# Patient Record
Sex: Female | Born: 1950 | Race: White | Hispanic: No | Marital: Married | Smoking: Never smoker
Health system: Southern US, Community
[De-identification: ages and names within clinical notes are randomized; demographics above are authoritative.]

## PROBLEM LIST (undated history)

## (undated) DIAGNOSIS — N189 Chronic kidney disease, unspecified: Secondary | ICD-10-CM

## (undated) DIAGNOSIS — E039 Hypothyroidism, unspecified: Secondary | ICD-10-CM

## (undated) DIAGNOSIS — M1990 Unspecified osteoarthritis, unspecified site: Secondary | ICD-10-CM

## (undated) DIAGNOSIS — R112 Nausea with vomiting, unspecified: Secondary | ICD-10-CM

## (undated) DIAGNOSIS — Z9889 Other specified postprocedural states: Secondary | ICD-10-CM

## (undated) DIAGNOSIS — K219 Gastro-esophageal reflux disease without esophagitis: Secondary | ICD-10-CM

## (undated) DIAGNOSIS — I10 Essential (primary) hypertension: Secondary | ICD-10-CM

## (undated) DIAGNOSIS — R911 Solitary pulmonary nodule: Secondary | ICD-10-CM

## (undated) DIAGNOSIS — E538 Deficiency of other specified B group vitamins: Secondary | ICD-10-CM

## (undated) DIAGNOSIS — E785 Hyperlipidemia, unspecified: Secondary | ICD-10-CM

## (undated) HISTORY — PX: JOINT REPLACEMENT: SHX530

## (undated) HISTORY — PX: BREAST CYST ASPIRATION: SHX578

## (undated) HISTORY — PX: DILATION AND CURETTAGE OF UTERUS: SHX78

## (undated) HISTORY — PX: APPENDECTOMY: SHX54

## (undated) HISTORY — PX: FRACTURE SURGERY: SHX138

## (undated) HISTORY — PX: CHOLECYSTECTOMY: SHX55

---

## 2005-06-02 ENCOUNTER — Ambulatory Visit: Payer: Self-pay | Admitting: Internal Medicine

## 2006-06-06 ENCOUNTER — Ambulatory Visit: Payer: Self-pay | Admitting: Internal Medicine

## 2007-01-22 ENCOUNTER — Ambulatory Visit: Payer: Self-pay | Admitting: Gastroenterology

## 2007-06-12 ENCOUNTER — Ambulatory Visit: Payer: Self-pay | Admitting: Internal Medicine

## 2008-06-12 ENCOUNTER — Ambulatory Visit: Payer: Self-pay | Admitting: Internal Medicine

## 2009-07-21 ENCOUNTER — Ambulatory Visit: Payer: Self-pay | Admitting: Internal Medicine

## 2010-08-10 ENCOUNTER — Ambulatory Visit: Payer: Self-pay | Admitting: Internal Medicine

## 2011-10-04 ENCOUNTER — Ambulatory Visit: Payer: Self-pay | Admitting: Internal Medicine

## 2012-10-04 ENCOUNTER — Ambulatory Visit: Payer: Self-pay | Admitting: Internal Medicine

## 2013-10-14 ENCOUNTER — Ambulatory Visit: Payer: Self-pay | Admitting: Internal Medicine

## 2014-10-15 ENCOUNTER — Ambulatory Visit: Payer: Self-pay | Admitting: Internal Medicine

## 2015-08-20 ENCOUNTER — Other Ambulatory Visit: Payer: Self-pay | Admitting: Internal Medicine

## 2015-08-20 DIAGNOSIS — Z1231 Encounter for screening mammogram for malignant neoplasm of breast: Secondary | ICD-10-CM

## 2015-10-20 ENCOUNTER — Ambulatory Visit
Admission: RE | Admit: 2015-10-20 | Discharge: 2015-10-20 | Disposition: A | Discharge status: Home / Self Care | Payer: BLUE CROSS/BLUE SHIELD | Source: Ambulatory Visit | Attending: Internal Medicine | Admitting: Internal Medicine

## 2015-10-20 DIAGNOSIS — Z1231 Encounter for screening mammogram for malignant neoplasm of breast: Secondary | ICD-10-CM | POA: Insufficient documentation

## 2016-08-23 ENCOUNTER — Other Ambulatory Visit: Payer: Self-pay | Admitting: Internal Medicine

## 2016-08-23 DIAGNOSIS — Z1231 Encounter for screening mammogram for malignant neoplasm of breast: Secondary | ICD-10-CM

## 2016-10-20 ENCOUNTER — Ambulatory Visit: Payer: BLUE CROSS/BLUE SHIELD

## 2016-10-21 ENCOUNTER — Ambulatory Visit
Admission: RE | Admit: 2016-10-21 | Discharge: 2016-10-21 | Disposition: A | Discharge status: Home / Self Care | Payer: BLUE CROSS/BLUE SHIELD | Source: Ambulatory Visit | Attending: Internal Medicine | Admitting: Internal Medicine

## 2016-10-21 DIAGNOSIS — Z1231 Encounter for screening mammogram for malignant neoplasm of breast: Secondary | ICD-10-CM | POA: Insufficient documentation

## 2017-08-11 ENCOUNTER — Encounter: Payer: Self-pay | Admitting: *Deleted

## 2017-08-14 ENCOUNTER — Encounter
Admission: RE | Disposition: A | Discharge status: Home / Self Care | Payer: Self-pay | Source: Ambulatory Visit | Attending: Gastroenterology

## 2017-08-14 ENCOUNTER — Ambulatory Visit: Payer: BLUE CROSS/BLUE SHIELD | Admitting: Anesthesiology

## 2017-08-14 ENCOUNTER — Encounter: Payer: Self-pay | Admitting: *Deleted

## 2017-08-14 ENCOUNTER — Ambulatory Visit
Admission: RE | Admit: 2017-08-14 | Discharge: 2017-08-14 | Disposition: A | Discharge status: Home / Self Care | Payer: BLUE CROSS/BLUE SHIELD | Source: Ambulatory Visit | Attending: Gastroenterology | Admitting: Gastroenterology

## 2017-08-14 DIAGNOSIS — Z8371 Family history of colonic polyps: Secondary | ICD-10-CM | POA: Diagnosis present

## 2017-08-14 DIAGNOSIS — Z79899 Other long term (current) drug therapy: Secondary | ICD-10-CM | POA: Diagnosis not present

## 2017-08-14 DIAGNOSIS — I10 Essential (primary) hypertension: Secondary | ICD-10-CM | POA: Diagnosis not present

## 2017-08-14 DIAGNOSIS — K5730 Diverticulosis of large intestine without perforation or abscess without bleeding: Secondary | ICD-10-CM | POA: Diagnosis not present

## 2017-08-14 DIAGNOSIS — K219 Gastro-esophageal reflux disease without esophagitis: Secondary | ICD-10-CM | POA: Diagnosis not present

## 2017-08-14 DIAGNOSIS — E039 Hypothyroidism, unspecified: Secondary | ICD-10-CM | POA: Diagnosis not present

## 2017-08-14 HISTORY — DX: Hypothyroidism, unspecified: E03.9

## 2017-08-14 HISTORY — DX: Hyperlipidemia, unspecified: E78.5

## 2017-08-14 HISTORY — DX: Solitary pulmonary nodule: R91.1

## 2017-08-14 HISTORY — DX: Gastro-esophageal reflux disease without esophagitis: K21.9

## 2017-08-14 HISTORY — DX: Nausea with vomiting, unspecified: R11.2

## 2017-08-14 HISTORY — DX: Essential (primary) hypertension: I10

## 2017-08-14 HISTORY — PX: COLONOSCOPY WITH PROPOFOL: SHX5780

## 2017-08-14 HISTORY — DX: Other specified postprocedural states: Z98.890

## 2017-08-14 SURGERY — COLONOSCOPY WITH PROPOFOL
Anesthesia: General

## 2017-08-14 MED ORDER — PROPOFOL 500 MG/50ML IV EMUL
INTRAVENOUS | Status: AC
  Filled 2017-08-14: qty 50

## 2017-08-14 MED ORDER — SODIUM CHLORIDE 0.9 % IV SOLN: INTRAVENOUS | Status: DC

## 2017-08-14 MED ORDER — PROPOFOL 500 MG/50ML IV EMUL
INTRAVENOUS | Status: DC | PRN
  Administered 2017-08-14: 150 ug/kg/min via INTRAVENOUS

## 2017-08-14 MED ORDER — PROPOFOL 10 MG/ML IV BOLUS
INTRAVENOUS | Status: DC | PRN
  Administered 2017-08-14: 60 mg via INTRAVENOUS

## 2017-08-14 MED ORDER — SODIUM CHLORIDE 0.9 % IV SOLN
INTRAVENOUS | Status: DC
  Administered 2017-08-14: 1000 mL via INTRAVENOUS

## 2017-08-14 NOTE — Anesthesia Procedure Notes (Signed)
Date/Time: 08/14/2017 4:09 PM Performed by: Junious SilkNOLES, Breanna Huestis Pre-anesthesia Checklist: Patient identified, Emergency Drugs available, Suction available, Patient being monitored and Timeout performed Oxygen Delivery Method: Nasal cannula

## 2017-08-14 NOTE — Anesthesia Postprocedure Evaluation (Signed)
Anesthesia Post Note  Patient: Breanna Hanson  Procedure(s) Performed: COLONOSCOPY WITH PROPOFOL (N/A )  Patient location during evaluation: Endoscopy Anesthesia Type: General Level of consciousness: awake and alert Pain management: pain level controlled Vital Signs Assessment: post-procedure vital signs reviewed and stable Respiratory status: spontaneous breathing, nonlabored ventilation, respiratory function stable and patient connected to nasal cannula oxygen Cardiovascular status: blood pressure returned to baseline and stable Postop Assessment: no apparent nausea or vomiting Anesthetic complications: no     Last Vitals:  Vitals:   08/14/17 1643 08/14/17 1653  BP:  128/84  Pulse:    Resp: 20 16  Temp:    SpO2:      Last Pain:  Vitals:   08/14/17 1623  TempSrc: Tympanic                 Lenard SimmerAndrew Gracemarie Skeet

## 2017-08-14 NOTE — H&P (Signed)
Outpatient short stay form Pre-procedure 08/14/2017 3:36 PM Christena DeemMartin U Usama Harkless MD  Primary Physician: Dr. Daniel NonesBert Klein  Reason for visit:  Colonoscopy  History of present illness:  Patient is a 66 year old female presenting today as above. She has a family history of colon polyps and a primary relative, father. She herself has not had polyps in the past. Her last colonoscopy was about 10 years ago. She tolerated her prep well. She takes no aspirin or blood thinning agents.    Current Facility-Administered Medications:  .  0.9 %  sodium chloride infusion, , Intravenous, Continuous, Christena DeemSkulskie, Detrich Rakestraw U, MD, Last Rate: 20 mL/hr at 08/14/17 1321, 1,000 mL at 08/14/17 1321 .  0.9 %  sodium chloride infusion, , Intravenous, Continuous, Christena DeemSkulskie, Tian Mcmurtrey U, MD  Prescriptions Prior to Admission  Medication Sig Dispense Refill Last Dose  . cyanocobalamin 1000 MCG tablet Take 1,000 mcg by mouth daily.   Past Week at Unknown time  . hydrochlorothiazide (HYDRODIURIL) 25 MG tablet Take 25 mg by mouth daily.   Past Week at Unknown time  . levothyroxine (SYNTHROID, LEVOTHROID) 125 MCG tablet Take 125 mcg by mouth daily before breakfast.   08/13/2017 at Unknown time  . Multiple Vitamin (MULTIVITAMIN) tablet Take 1 tablet by mouth daily.   Past Week at Unknown time  . simvastatin (ZOCOR) 40 MG tablet Take 40 mg by mouth daily.   08/13/2017 at Unknown time  . omeprazole (PRILOSEC) 20 MG capsule Take 20 mg by mouth daily.   Not Taking at Unknown time     Allergies  Allergen Reactions  . Fish-Derived Products     oils     Past Medical History:  Diagnosis Date  . Elevated lipids   . GERD (gastroesophageal reflux disease)   . Hypertension   . Hypothyroidism   . PONV (postoperative nausea and vomiting)   . Pulmonary nodule     Review of systems:      Physical Exam    Heart and lungs: Regular rate and rhythm without rub or gallop, lungs are bilaterally clear.    HEENT: Normocephalic atraumatic  eyes are anicteric    Other:     Pertinant exam for procedure: Soft nontender nondistended bowel sounds positive normoactive.    Planned proceedures: Colonoscopy and indicated procedures. I have discussed the risks benefits and complications of procedures to include not limited to bleeding, infection, perforation and the risk of sedation and the patient wishes to proceed.    Christena DeemMartin U Judee Hennick, MD Gastroenterology 08/14/2017  3:36 PM

## 2017-08-14 NOTE — Anesthesia Post-op Follow-up Note (Signed)
Anesthesia QCDR form completed.        

## 2017-08-14 NOTE — Anesthesia Preprocedure Evaluation (Signed)
Anesthesia Evaluation  Patient identified by MRN, date of birth, ID band Patient awake    Reviewed: Allergy & Precautions, NPO status , Patient's Chart, lab work & pertinent test results  History of Anesthesia Complications (+) PONV  Airway Mallampati: II       Dental  (+) Teeth Intact   Pulmonary neg pulmonary ROS,    breath sounds clear to auscultation       Cardiovascular Exercise Tolerance: Good hypertension, Pt. on medications  Rhythm:Regular Rate:Normal     Neuro/Psych negative neurological ROS     GI/Hepatic Neg liver ROS, GERD  Medicated,  Endo/Other  Hypothyroidism   Renal/GU negative Renal ROS  negative genitourinary   Musculoskeletal negative musculoskeletal ROS (+)   Abdominal (+) + obese,   Peds negative pediatric ROS (+)  Hematology negative hematology ROS (+)   Anesthesia Other Findings   Reproductive/Obstetrics                             Anesthesia Physical Anesthesia Plan  ASA: II  Anesthesia Plan: General   Post-op Pain Management:    Induction: Intravenous  PONV Risk Score and Plan: 1 and Ondansetron  Airway Management Planned: Natural Airway and Nasal Cannula  Additional Equipment:   Intra-op Plan:   Post-operative Plan:   Informed Consent: I have reviewed the patients History and Physical, chart, labs and discussed the procedure including the risks, benefits and alternatives for the proposed anesthesia with the patient or authorized representative who has indicated his/her understanding and acceptance.     Plan Discussed with: CRNA  Anesthesia Plan Comments:         Anesthesia Quick Evaluation

## 2017-08-14 NOTE — Op Note (Signed)
[facility] Gastroenterology Patient Name: Breanna Hanson Procedure Date: 08/14/2017 3:30 PM MRN: [id number] Account #: [id number] Date of Birth: September 16, 1951 Admit Type: Outpatient Age: 66 Room: [facility] ENDO ROOM 1 Gender: Female Note Status: Finalized Procedure:            Colonoscopy Indications:          Family history of colonic polyps in a first-degree                        relative Providers:            Christena Deem, MD Referring MD:         Daniel Nones, MD (Referring MD) Medicines:            Monitored Anesthesia Care Complications:        No immediate complications. Procedure:            Pre-Anesthesia Assessment:                       - ASA Grade Assessment: II - A patient with mild                        systemic disease.                       After obtaining informed consent, the colonoscope was                        passed under direct vision. Throughout the procedure,                        the patient's blood pressure, pulse, and oxygen                        saturations were monitored continuously. The Olympus                        PCF-H180AL colonoscope ( S#: [id number] ) was introduced                        through the anus and advanced to the the cecum,                        identified by appendiceal orifice and ileocecal valve.                        The colonoscopy was unusually difficult due to poor                        bowel prep. The patient tolerated the procedure well.                        The quality of the bowel preparation was fair except                        the ascending colon was poor. Findings:      Multiple small-mouthed diverticula were found in the sigmoid colon and       descending colon.      A large amount of semi-liquid stool was found in the transverse colon,  in the ascending colon and in the cecum, interfering with visualization,       however no larger lesions were noted.      The digital rectal exam was  normal. Impression:           - Diverticulosis in the sigmoid colon and in the                        descending colon.                       - Stool in the transverse colon, in the ascending colon                        and in the cecum.                       - No specimens collected. Recommendation:       - Repeat colonoscopy in 2 years for screening purposes. Procedure Code(s):    --- Professional ---                       [phone], Colonoscopy, flexible; diagnostic, including                        collection of specimen(s) by brushing or washing, when                        performed (separate procedure) Diagnosis Code(s):    --- Professional ---                       Z83.71, Family history of colonic polyps                       K57.30, Diverticulosis of large intestine without                        perforation or abscess without bleeding CPT copyright 2016 [employer]. All rights reserved. The codes documented in this report are preliminary and upon coder review may  be revised to meet current compliance requirements. Christena DeemMartin U Skulskie, MD 08/14/2017 4:25:00 PM This report has been signed electronically. Number of Addenda: 0 Note Initiated On: 08/14/2017 3:30 PM Scope Withdrawal Time: 0 hours 9 minutes 11 seconds  Total Procedure Duration: 0 hours 31 minutes 26 seconds       [facility]

## 2017-08-14 NOTE — Transfer of Care (Signed)
Immediate Anesthesia Transfer of Care Note  Patient: Breanna PandyNancy B Muramoto  Procedure(s) Performed: COLONOSCOPY WITH PROPOFOL (N/A )  Patient Location: PACU  Anesthesia Type:General  Level of Consciousness: sedated  Airway & Oxygen Therapy: Patient Spontanous Breathing and Patient connected to nasal cannula oxygen  Post-op Assessment: Report given to RN  Post vital signs: Reviewed and stable  Last Vitals:  Vitals:   08/14/17 1308 08/14/17 1623  BP: (!) 157/75 (!) 98/50  Pulse: 63   Resp: 20   Temp: (!) 36.4 C 36.5 C  SpO2: 100%     Last Pain:  Vitals:   08/14/17 1623  TempSrc: Tympanic         Complications: No apparent anesthesia complications

## 2017-08-15 ENCOUNTER — Encounter: Payer: Self-pay | Admitting: Gastroenterology

## 2017-08-29 ENCOUNTER — Other Ambulatory Visit: Payer: Self-pay | Admitting: Internal Medicine

## 2017-08-29 DIAGNOSIS — Z1231 Encounter for screening mammogram for malignant neoplasm of breast: Secondary | ICD-10-CM

## 2017-10-23 ENCOUNTER — Other Ambulatory Visit: Payer: Self-pay | Admitting: Internal Medicine

## 2017-10-23 ENCOUNTER — Ambulatory Visit
Admission: RE | Admit: 2017-10-23 | Discharge: 2017-10-23 | Disposition: A | Discharge status: Home / Self Care | Payer: BLUE CROSS/BLUE SHIELD | Source: Ambulatory Visit | Attending: Internal Medicine | Admitting: Internal Medicine

## 2017-10-23 DIAGNOSIS — Z1231 Encounter for screening mammogram for malignant neoplasm of breast: Secondary | ICD-10-CM

## 2018-01-22 HISTORY — PX: FRACTURE SURGERY: SHX138

## 2018-02-26 ENCOUNTER — Other Ambulatory Visit: Payer: Self-pay | Admitting: Internal Medicine

## 2018-02-26 DIAGNOSIS — M25561 Pain in right knee: Secondary | ICD-10-CM

## 2018-03-08 ENCOUNTER — Ambulatory Visit
Admission: RE | Admit: 2018-03-08 | Discharge: 2018-03-08 | Disposition: A | Discharge status: Home / Self Care | Payer: BLUE CROSS/BLUE SHIELD | Source: Ambulatory Visit | Attending: Internal Medicine | Admitting: Internal Medicine

## 2018-03-08 DIAGNOSIS — S82144A Nondisplaced bicondylar fracture of right tibia, initial encounter for closed fracture: Secondary | ICD-10-CM | POA: Diagnosis not present

## 2018-03-08 DIAGNOSIS — M25561 Pain in right knee: Secondary | ICD-10-CM

## 2018-03-08 DIAGNOSIS — S83221A Peripheral tear of medial meniscus, current injury, right knee, initial encounter: Secondary | ICD-10-CM | POA: Diagnosis not present

## 2018-03-08 DIAGNOSIS — X58XXXA Exposure to other specified factors, initial encounter: Secondary | ICD-10-CM | POA: Diagnosis not present

## 2018-03-08 DIAGNOSIS — M1711 Unilateral primary osteoarthritis, right knee: Secondary | ICD-10-CM | POA: Diagnosis not present

## 2018-03-08 DIAGNOSIS — S83241A Other tear of medial meniscus, current injury, right knee, initial encounter: Secondary | ICD-10-CM | POA: Insufficient documentation

## 2018-03-21 ENCOUNTER — Encounter: Payer: Self-pay | Admitting: Anesthesiology

## 2018-03-22 ENCOUNTER — Ambulatory Visit
Admission: RE | Admit: 2018-03-22 | Discharge: 2018-03-22 | Disposition: A | Discharge status: Home / Self Care | Payer: BLUE CROSS/BLUE SHIELD | Source: Ambulatory Visit | Attending: Orthopedic Surgery | Admitting: Orthopedic Surgery

## 2018-03-22 ENCOUNTER — Ambulatory Visit: Payer: BLUE CROSS/BLUE SHIELD

## 2018-03-22 ENCOUNTER — Encounter
Admission: RE | Disposition: A | Discharge status: Home / Self Care | Payer: Self-pay | Source: Ambulatory Visit | Attending: Orthopedic Surgery

## 2018-03-22 ENCOUNTER — Ambulatory Visit: Payer: BLUE CROSS/BLUE SHIELD | Admitting: Anesthesiology

## 2018-03-22 DIAGNOSIS — Z79899 Other long term (current) drug therapy: Secondary | ICD-10-CM | POA: Diagnosis not present

## 2018-03-22 DIAGNOSIS — K219 Gastro-esophageal reflux disease without esophagitis: Secondary | ICD-10-CM | POA: Diagnosis not present

## 2018-03-22 DIAGNOSIS — I10 Essential (primary) hypertension: Secondary | ICD-10-CM | POA: Insufficient documentation

## 2018-03-22 DIAGNOSIS — M84361A Stress fracture, right tibia, initial encounter for fracture: Secondary | ICD-10-CM | POA: Insufficient documentation

## 2018-03-22 DIAGNOSIS — W010XXA Fall on same level from slipping, tripping and stumbling without subsequent striking against object, initial encounter: Secondary | ICD-10-CM | POA: Insufficient documentation

## 2018-03-22 DIAGNOSIS — S83241A Other tear of medial meniscus, current injury, right knee, initial encounter: Secondary | ICD-10-CM | POA: Diagnosis present

## 2018-03-22 DIAGNOSIS — E538 Deficiency of other specified B group vitamins: Secondary | ICD-10-CM | POA: Diagnosis not present

## 2018-03-22 DIAGNOSIS — Y92007 Garden or yard of unspecified non-institutional (private) residence as the place of occurrence of the external cause: Secondary | ICD-10-CM | POA: Diagnosis not present

## 2018-03-22 DIAGNOSIS — E039 Hypothyroidism, unspecified: Secondary | ICD-10-CM | POA: Diagnosis not present

## 2018-03-22 DIAGNOSIS — M25561 Pain in right knee: Secondary | ICD-10-CM | POA: Insufficient documentation

## 2018-03-22 DIAGNOSIS — Z419 Encounter for procedure for purposes other than remedying health state, unspecified: Secondary | ICD-10-CM

## 2018-03-22 HISTORY — PX: KNEE ARTHROSCOPY WITH SUBCHONDROPLASTY: SHX6732

## 2018-03-22 SURGERY — ARTHROSCOPY, KNEE, WITH SUBCHONDROPLASTY
Anesthesia: General | Site: Knee | Laterality: Right | Wound class: "Clean "

## 2018-03-22 MED ORDER — ONDANSETRON HCL 4 MG/2ML IJ SOLN
INTRAMUSCULAR | Status: DC | PRN
  Administered 2018-03-22: 4 mg via INTRAVENOUS

## 2018-03-22 MED ORDER — MIDAZOLAM HCL 2 MG/2ML IJ SOLN
INTRAMUSCULAR | Status: DC | PRN
  Administered 2018-03-22: 2 mg via INTRAVENOUS

## 2018-03-22 MED ORDER — BUPIVACAINE-EPINEPHRINE (PF) 0.5% -1:200000 IJ SOLN
INTRAMUSCULAR | Status: AC
  Filled 2018-03-22: qty 30

## 2018-03-22 MED ORDER — OXYCODONE HCL 5 MG PO TABS
5.0000 mg | ORAL_TABLET | Freq: Once | ORAL | Status: AC
  Administered 2018-03-22: 5 mg via ORAL

## 2018-03-22 MED ORDER — OXYCODONE HCL 5 MG PO TABS
ORAL_TABLET | ORAL | Status: AC
  Filled 2018-03-22: qty 1

## 2018-03-22 MED ORDER — FENTANYL CITRATE (PF) 100 MCG/2ML IJ SOLN
INTRAMUSCULAR | Status: AC
  Administered 2018-03-22: 25 ug via INTRAVENOUS
  Filled 2018-03-22: qty 2

## 2018-03-22 MED ORDER — LACTATED RINGERS IV SOLN
INTRAVENOUS | Status: DC
  Administered 2018-03-22: 06:00:00 via INTRAVENOUS

## 2018-03-22 MED ORDER — FENTANYL CITRATE (PF) 100 MCG/2ML IJ SOLN
INTRAMUSCULAR | Status: DC | PRN
  Administered 2018-03-22 (×2): 50 ug via INTRAVENOUS

## 2018-03-22 MED ORDER — FENTANYL CITRATE (PF) 100 MCG/2ML IJ SOLN
25.0000 ug | INTRAMUSCULAR | Status: DC | PRN
  Administered 2018-03-22 (×2): 25 ug via INTRAVENOUS

## 2018-03-22 MED ORDER — PROPOFOL 10 MG/ML IV BOLUS
INTRAVENOUS | Status: AC
  Filled 2018-03-22: qty 20

## 2018-03-22 MED ORDER — FENTANYL CITRATE (PF) 100 MCG/2ML IJ SOLN
INTRAMUSCULAR | Status: AC
  Filled 2018-03-22: qty 2

## 2018-03-22 MED ORDER — HYDROCODONE-ACETAMINOPHEN 5-325 MG PO TABS: 1.0000 | ORAL_TABLET | ORAL | Status: DC | PRN

## 2018-03-22 MED ORDER — CEFAZOLIN SODIUM-DEXTROSE 2-4 GM/100ML-% IV SOLN
INTRAVENOUS | Status: AC
  Filled 2018-03-22: qty 100

## 2018-03-22 MED ORDER — DEXAMETHASONE SODIUM PHOSPHATE 10 MG/ML IJ SOLN
INTRAMUSCULAR | Status: DC | PRN
  Administered 2018-03-22: 10 mg via INTRAVENOUS

## 2018-03-22 MED ORDER — BUPIVACAINE-EPINEPHRINE (PF) 0.5% -1:200000 IJ SOLN
INTRAMUSCULAR | Status: DC | PRN
  Administered 2018-03-22: 30 mL

## 2018-03-22 MED ORDER — LIDOCAINE HCL (CARDIAC) PF 100 MG/5ML IV SOSY
PREFILLED_SYRINGE | INTRAVENOUS | Status: DC | PRN
  Administered 2018-03-22: 100 mg via INTRAVENOUS

## 2018-03-22 MED ORDER — PROPOFOL 10 MG/ML IV BOLUS
INTRAVENOUS | Status: DC | PRN
  Administered 2018-03-22: 150 mg via INTRAVENOUS

## 2018-03-22 MED ORDER — ONDANSETRON HCL 4 MG/2ML IJ SOLN: 4.0000 mg | Freq: Once | INTRAMUSCULAR | Status: DC | PRN

## 2018-03-22 MED ORDER — MIDAZOLAM HCL 2 MG/2ML IJ SOLN
INTRAMUSCULAR | Status: AC
  Filled 2018-03-22: qty 2

## 2018-03-22 MED ORDER — PHENYLEPHRINE HCL 10 MG/ML IJ SOLN
INTRAMUSCULAR | Status: DC | PRN
  Administered 2018-03-22 (×5): 100 ug via INTRAVENOUS

## 2018-03-22 MED ORDER — HYDROCODONE-ACETAMINOPHEN 7.5-325 MG PO TABS: 1.0000 | ORAL_TABLET | Freq: Four times a day (QID) | ORAL | 0 refills | Status: DC | PRN

## 2018-03-22 SURGICAL SUPPLY — 28 items
BANDAGE ACE 4X5 VEL STRL LF (GAUZE/BANDAGES/DRESSINGS) ×2 IMPLANT
BLADE INCISOR PLUS 4.5 (BLADE) ×2 IMPLANT
CHLORAPREP W/TINT 26ML (MISCELLANEOUS) ×2 IMPLANT
CUFF TOURN 24 STER (MISCELLANEOUS) IMPLANT
CUFF TOURN 30 STER DUAL PORT (MISCELLANEOUS) IMPLANT
DRAPE C-ARM XRAY 36X54 (DRAPES) ×2 IMPLANT
DRAPE C-ARMOR (DRAPES) ×2 IMPLANT
GAUZE SPONGE 4X4 12PLY STRL (GAUZE/BANDAGES/DRESSINGS) ×2 IMPLANT
GLOVE SURG SYN 9.0  PF PI (GLOVE) ×1
GLOVE SURG SYN 9.0 PF PI (GLOVE) ×1 IMPLANT
GOWN SRG 2XL LVL 4 RGLN SLV (GOWNS) ×1 IMPLANT
GOWN STRL NON-REIN 2XL LVL4 (GOWNS) ×1
GOWN STRL REUS W/ TWL LRG LVL3 (GOWN DISPOSABLE) ×1 IMPLANT
GOWN STRL REUS W/TWL LRG LVL3 (GOWN DISPOSABLE) ×1
IV LACTATED RINGER IRRG 3000ML (IV SOLUTION) ×4
IV LR IRRIG 3000ML ARTHROMATIC (IV SOLUTION) ×4 IMPLANT
KIT MIXER ACCUMIX (KITS) ×1 IMPLANT
KIT TURNOVER KIT A (KITS) ×2 IMPLANT
KNEE KIT SCP W/SIDE ACCUPORT (Joint) ×1 IMPLANT
MANIFOLD NEPTUNE II (INSTRUMENTS) ×2 IMPLANT
PACK ARTHROSCOPY KNEE (MISCELLANEOUS) ×2 IMPLANT
SET TUBE SUCT SHAVER OUTFL 24K (TUBING) ×2 IMPLANT
SET TUBE TIP INTRA-ARTICULAR (MISCELLANEOUS) ×2 IMPLANT
SUT ETHILON 4-0 (SUTURE) ×1
SUT ETHILON 4-0 FS2 18XMFL BLK (SUTURE) ×1
SUTURE ETHLN 4-0 FS2 18XMF BLK (SUTURE) ×1 IMPLANT
TUBING ARTHRO INFLOW-ONLY STRL (TUBING) ×2 IMPLANT
WAND COBLATION FLOW 50 (SURGICAL WAND) ×2 IMPLANT

## 2018-03-22 NOTE — Anesthesia Post-op Follow-up Note (Signed)
Anesthesia QCDR form completed.        

## 2018-03-22 NOTE — Transfer of Care (Signed)
Immediate Anesthesia Transfer of Care Note  Patient: Breanna Hanson  Procedure(s) Performed: KNEE ARTHROSCOPY WITH SUBCHONDROPLASTY WITH PARTIAL MEDIAL MENISECTOMY (Right Knee)  Patient Location: PACU  Anesthesia Type:General  Level of Consciousness: sedated  Airway & Oxygen Therapy: Patient Spontanous Breathing and Patient connected to face mask oxygen  Post-op Assessment: Report given to RN and Post -op Vital signs reviewed and stable  Post vital signs: Reviewed and stable  Last Vitals:  Vitals Value Taken Time  BP    Temp    Pulse    Resp    SpO2      Last Pain:  Vitals:   03/22/18 0623  TempSrc: Oral  PainSc: 6          Complications: No apparent anesthesia complications

## 2018-03-22 NOTE — H&P (Signed)
Reviewed paper H+P, will be scanned into chart. No changes noted.  

## 2018-03-22 NOTE — Discharge Instructions (Signed)
As tolerated right leg.  Pain medicine as directed.  Keep dressing clean and dry.

## 2018-03-22 NOTE — Anesthesia Procedure Notes (Signed)
Procedure Name: LMA Insertion Date/Time: 03/22/2018 7:40 AM Performed by: Junious Silk, CRNA Pre-anesthesia Checklist: Patient identified, Patient being monitored, Timeout performed, Emergency Drugs available and Suction available Patient Re-evaluated:Patient Re-evaluated prior to induction Oxygen Delivery Method: Circle system utilized Preoxygenation: Pre-oxygenation with 100% oxygen Induction Type: IV induction Ventilation: Mask ventilation without difficulty LMA: LMA inserted LMA Size: 4.0 Tube type: Oral Number of attempts: 1 Placement Confirmation: positive ETCO2 and breath sounds checked- equal and bilateral Tube secured with: Tape Dental Injury: Teeth and Oropharynx as per pre-operative assessment

## 2018-03-22 NOTE — Op Note (Signed)
03/22/2018  8:25 AM  PATIENT:  Breanna Hanson  67 y.o. female  PRE-OPERATIVE DIAGNOSIS:  acute pain right knee insufficiency fracture medial tibia with medial meniscus tear  POST-OPERATIVE DIAGNOSIS:  acute pain right knee pain  PROCEDURE:  Procedure(s): KNEE ARTHROSCOPY WITH SUBCHONDROPLASTY WITH PARTIAL MEDIAL MENISECTOMY (Right)  SURGEON: Laurene Footman, MD  ASSISTANTS: none  ANESTHESIA:   general  EBL:  No intake/output data recorded.  BLOOD ADMINISTERED:none  DRAINS: none   LOCAL MEDICATIONS USED:  MARCAINE     SPECIMEN:  No Specimen  DISPOSITION OF SPECIMEN:  N/A  COUNTS:  YES  TOURNIQUET:  * Missing tourniquet times found for documented tourniquets in log: [id number] *none  IMPLANTS: Zimmer bone paste from sub-chondroplasty kit  DICTATION: .Dragon Dictation patient brought the operating room and after adequate anesthesia was obtained the leg was prepped and draped in sterile fashion with a tourniquet applied but not utilized.  After prepping and draping in sterile manner appropriate patient identification and timeout procedures were completed.  An inferolateral portal evaluation was of mild patellofemoral degenerative change no loose bodies in the gutters around the medial compartment inferior medial portal was made on probing there is a complex tear posterior third of the meniscus with an oblique tear that also had horizontal component consistent with MRI findings this was ablated with the ArthroCare wand.  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  There is no normal-appearing articular cartilage fibrillation of services.  The notch was normal with intact ACL and lateral compartment was normal without degenerative change or tear this point serum was utilized to get a starting point sub-chondroplasty under the medial tibia.  A small incision was made drill introduced in  the subchondral bone of the medial tibia in the midline on the lateral view.  Drilling was carried out and then the left in place with the putty mix this was then inserted with good fill in the subchondral bone.  After he did set the drill was removed and there is no extravasation permanent serum views obtained.  The knee was then rescoped again no extravasation of the bone putty into the joint.  It was irrigated until clear and ultra mentation withdrawn.  Wounds were closed with simple 4-0 nylon single suture below 30 cc of half percent Sensorcaine for postop analgesia.  Xeroform 4 x 4 web roll and Ace wrap applied  PLAN OF CARE: Discharge to home after PACU  PATIENT DISPOSITION:  PACU - hemodynamically stable.

## 2018-03-22 NOTE — Anesthesia Postprocedure Evaluation (Signed)
Anesthesia Post Note  Patient: Breanna Hanson  Procedure(s) Performed: KNEE ARTHROSCOPY WITH SUBCHONDROPLASTY WITH PARTIAL MEDIAL MENISECTOMY (Right Knee)  Patient location during evaluation: PACU Anesthesia Type: General Level of consciousness: awake and alert Pain management: pain level controlled Vital Signs Assessment: post-procedure vital signs reviewed and stable Respiratory status: spontaneous breathing, nonlabored ventilation, respiratory function stable and patient connected to nasal cannula oxygen Cardiovascular status: blood pressure returned to baseline and stable Postop Assessment: no apparent nausea or vomiting Anesthetic complications: no     Last Vitals:  Vitals:   03/22/18 0910 03/22/18 0920  BP: 115/68 (!) 149/78  Pulse: 63 66  Resp: 14 18  Temp:  36.4 C  SpO2: 98% 97%    Last Pain:  Vitals:   03/22/18 0920  TempSrc: Oral  PainSc: 3                  Rissie Sculley S

## 2018-03-22 NOTE — Anesthesia Preprocedure Evaluation (Signed)
Anesthesia Evaluation  Patient identified by MRN, date of birth, ID band Patient awake    Reviewed: Allergy & Precautions, NPO status , Patient's Chart, lab work & pertinent test results, reviewed documented beta blocker date and time   History of Anesthesia Complications (+) PONV and history of anesthetic complications  Airway Mallampati: III  TM Distance: >3 FB     Dental  (+) Chipped   Pulmonary           Cardiovascular hypertension, Pt. on medications      Neuro/Psych    GI/Hepatic GERD  Controlled,  Endo/Other  Hypothyroidism   Renal/GU      Musculoskeletal   Abdominal   Peds  Hematology   Anesthesia Other Findings   Reproductive/Obstetrics                             Anesthesia Physical Anesthesia Plan  ASA: III  Anesthesia Plan: General   Post-op Pain Management:    Induction: Intravenous  PONV Risk Score and Plan:   Airway Management Planned: LMA  Additional Equipment:   Intra-op Plan:   Post-operative Plan:   Informed Consent: I have reviewed the patients History and Physical, chart, labs and discussed the procedure including the risks, benefits and alternatives for the proposed anesthesia with the patient or authorized representative who has indicated his/her understanding and acceptance.     Plan Discussed with: CRNA  Anesthesia Plan Comments:         Anesthesia Quick Evaluation

## 2018-05-30 ENCOUNTER — Other Ambulatory Visit: Payer: Self-pay

## 2018-05-30 ENCOUNTER — Encounter
Admission: RE | Admit: 2018-05-30 | Discharge: 2018-05-30 | Disposition: A | Discharge status: Home / Self Care | Payer: BLUE CROSS/BLUE SHIELD | Source: Ambulatory Visit | Attending: Orthopedic Surgery | Admitting: Orthopedic Surgery

## 2018-05-30 DIAGNOSIS — I10 Essential (primary) hypertension: Secondary | ICD-10-CM | POA: Diagnosis not present

## 2018-05-30 DIAGNOSIS — Z0183 Encounter for blood typing: Secondary | ICD-10-CM | POA: Diagnosis not present

## 2018-05-30 DIAGNOSIS — Z01812 Encounter for preprocedural laboratory examination: Secondary | ICD-10-CM | POA: Insufficient documentation

## 2018-05-30 DIAGNOSIS — Z01818 Encounter for other preprocedural examination: Secondary | ICD-10-CM | POA: Diagnosis not present

## 2018-05-30 HISTORY — DX: Unspecified osteoarthritis, unspecified site: M19.90

## 2018-05-30 LAB — BASIC METABOLIC PANEL
Anion gap: 10 (ref 5–15)
BUN: 18 mg/dL (ref 8–23)
CALCIUM: 9.7 mg/dL (ref 8.9–10.3)
CO2: 29 mmol/L (ref 22–32)
CREATININE: 0.84 mg/dL (ref 0.44–1.00)
Chloride: 99 mmol/L (ref 98–111)
GFR calc Af Amer: >60 mL/min (ref >60)
GFR calc non Af Amer: >60 mL/min (ref >60)
GLUCOSE: 129 mg/dL — AB (ref 70–99)
Potassium: 3.4 mmol/L — ABNORMAL LOW (ref 3.5–5.1)
Sodium: 138 mmol/L (ref 135–145)

## 2018-05-30 LAB — CBC
HEMATOCRIT: 40.9 % (ref 35.0–47.0)
Hemoglobin: 14.2 g/dL (ref 12.0–16.0)
MCH: 31.3 pg (ref 26.0–34.0)
MCHC: 34.8 g/dL (ref 32.0–36.0)
MCV: 89.9 fL (ref 80.0–100.0)
Platelets: 219 10*3/uL (ref 150–440)
RBC: 4.55 MIL/uL (ref 3.80–5.20)
RDW: 13.2 % (ref 11.5–14.5)
WBC: 7 10*3/uL (ref 3.6–11.0)

## 2018-05-30 LAB — URINALYSIS, ROUTINE W REFLEX MICROSCOPIC
BILIRUBIN URINE: NEGATIVE
GLUCOSE, UA: NEGATIVE mg/dL
HGB URINE DIPSTICK: NEGATIVE
KETONES UR: NEGATIVE mg/dL
NITRITE: POSITIVE — AB
PROTEIN: NEGATIVE mg/dL
Specific Gravity, Urine: 1.01 (ref 1.005–1.030)
pH: 6 (ref 5.0–8.0)

## 2018-05-30 LAB — PROTIME-INR
INR: 0.94
Prothrombin Time: 12.5 seconds (ref 11.4–15.2)

## 2018-05-30 LAB — TYPE AND SCREEN
ABO/RH(D): A POS
Antibody Screen: NEGATIVE

## 2018-05-30 LAB — SURGICAL PCR SCREEN
MRSA, PCR: NEGATIVE
STAPHYLOCOCCUS AUREUS: NEGATIVE

## 2018-05-30 LAB — APTT: aPTT: 28 seconds (ref 24–36)

## 2018-05-30 LAB — SEDIMENTATION RATE: SED RATE: 19 mm/h (ref 0–30)

## 2018-05-30 NOTE — Patient Instructions (Signed)
Your procedure is scheduled on: Tuesday 06/12/18 Report to DAY SURGERY DEPARTMENT LOCATED ON 2ND FLOOR MEDICAL MALL ENTRANCE. To find out your arrival time please call [phone] between 1PM - 3PM on Monday 06/11/18.  Remember: Instructions that are not followed completely may result in serious medical risk, up to and including death, or upon the discretion of your surgeon and anesthesiologist your surgery may need to be rescheduled.     _X__ 1. Do not eat food after midnight the night before your procedure.                 No gum chewing or hard candies. You may drink clear liquids up to 2 hours                 before you are scheduled to arrive for your surgery- DO not drink clear                 liquids within 2 hours of the start of your surgery.                 Clear Liquids include:  water, apple juice without pulp, clear carbohydrate                 drink such as Clearfast or Gatorade, Black Coffee or Tea (Do not add                 anything to coffee or tea).  __X__2.  On the morning of surgery brush your teeth with toothpaste and water, you                 may rinse your mouth with mouthwash if you wish.  Do not swallow any              toothpaste of mouthwash.     _X__ 3.  No Alcohol for 24 hours before or after surgery.   _X__ 4.  Do Not Smoke or use e-cigarettes For 24 Hours Prior to Your Surgery.                 Do not use any chewable tobacco products for at least 6 hours prior to                 surgery.  ____  5.  Bring all medications with you on the day of surgery if instructed.   __X__  6.  Notify your doctor if there is any change in your medical condition      (cold, fever, infections).     Do not wear jewelry, make-up, hairpins, clips or nail polish. Do not wear lotions, powders, or perfumes.  Do not shave 48 hours prior to surgery. Men may shave face and neck. Do not bring valuables to the hospital.    [facility] not responsible for any belongings or  valuables.  Contacts, dentures/partials or body piercings may not be worn into surgery. Bring a case for your contacts, glasses or hearing aids, a denture cup will be supplied. Leave your suitcase in the car. After surgery it may be brought to your room. For patients admitted to the hospital, discharge time is determined by your treatment team.   Patients discharged the day of surgery will not be allowed to drive home.   Please read over the following fact sheets that you were given:   MRSA Information  __X__ Take these medicines ONLY the morning of surgery with A SIP OF WATER:  1. levothyroxine (SYNTHROID, LEVOTHROID)   2. ranitidine (ZANTAC)  3.   4.  5.  6.  ____ Fleet Enema (as directed)   __X__ Use CHG Soap/SAGE wipes as directed  ____ Use inhalers on the day of surgery  ____ Stop metformin/Janumet/Farxiga 2 days prior to surgery    ____ Take 1/2 of usual insulin dose the night before surgery. No insulin the morning          of surgery.   ____ Stop Blood Thinners Coumadin/Plavix/Xarelto/Pleta/Pradaxa/Eliquis/Effient/Aspirin  on   Or contact your Surgeon, Cardiologist or Medical Doctor regarding  ability to stop your blood thinners  __X__ Stop Anti-inflammatories 7 days before surgery such as Advil, Ibuprofen, Motrin,  BC or Goodies Powder, Naprosyn, Naproxen, Aleve, Aspirin and Meloxicam YOU MAY TAKE OVER THE COUNTER TYLENOL IF NEEDED   __X__ Stop all herbal supplements, fish oil or vitamin E until after surgery.  YOUR VITAMIN D, B12 AND MAGNESIUM ARE OK TO CONTINUE  ____ Bring C-Pap to the hospital.

## 2018-05-31 NOTE — Pre-Procedure Instructions (Signed)
ua faxed to dr Rosita Keamenz

## 2018-06-02 LAB — URINE CULTURE

## 2018-06-11 MED ORDER — CEFAZOLIN SODIUM-DEXTROSE 2-4 GM/100ML-% IV SOLN
2.0000 g | Freq: Once | INTRAVENOUS | Status: AC
  Administered 2018-06-12: 2 g via INTRAVENOUS

## 2018-06-11 MED ORDER — TRANEXAMIC ACID 1000 MG/10ML IV SOLN
1000.0000 mg | INTRAVENOUS | Status: AC
  Administered 2018-06-12: 1000 mg via INTRAVENOUS
  Filled 2018-06-11: qty 1000

## 2018-06-12 ENCOUNTER — Inpatient Hospital Stay: Payer: BLUE CROSS/BLUE SHIELD

## 2018-06-12 ENCOUNTER — Other Ambulatory Visit: Payer: Self-pay

## 2018-06-12 ENCOUNTER — Encounter
Admission: RE | Disposition: A | Discharge status: Home Health | Payer: Self-pay | Source: Ambulatory Visit | Attending: Orthopedic Surgery

## 2018-06-12 ENCOUNTER — Inpatient Hospital Stay
Admission: RE | Admit: 2018-06-12 | Discharge: 2018-06-14 | DRG: 470 | Disposition: A | Discharge status: Home Health | Payer: BLUE CROSS/BLUE SHIELD | Source: Ambulatory Visit | Attending: Orthopedic Surgery | Admitting: Orthopedic Surgery

## 2018-06-12 DIAGNOSIS — Z419 Encounter for procedure for purposes other than remedying health state, unspecified: Secondary | ICD-10-CM

## 2018-06-12 DIAGNOSIS — E039 Hypothyroidism, unspecified: Secondary | ICD-10-CM | POA: Diagnosis present

## 2018-06-12 DIAGNOSIS — Z96642 Presence of left artificial hip joint: Secondary | ICD-10-CM

## 2018-06-12 DIAGNOSIS — G8918 Other acute postprocedural pain: Secondary | ICD-10-CM

## 2018-06-12 DIAGNOSIS — Z79899 Other long term (current) drug therapy: Secondary | ICD-10-CM

## 2018-06-12 DIAGNOSIS — K219 Gastro-esophageal reflux disease without esophagitis: Secondary | ICD-10-CM | POA: Diagnosis present

## 2018-06-12 DIAGNOSIS — I10 Essential (primary) hypertension: Secondary | ICD-10-CM | POA: Diagnosis present

## 2018-06-12 DIAGNOSIS — E785 Hyperlipidemia, unspecified: Secondary | ICD-10-CM | POA: Diagnosis present

## 2018-06-12 DIAGNOSIS — Z91018 Allergy to other foods: Secondary | ICD-10-CM | POA: Diagnosis not present

## 2018-06-12 DIAGNOSIS — M1612 Unilateral primary osteoarthritis, left hip: Secondary | ICD-10-CM | POA: Diagnosis present

## 2018-06-12 HISTORY — PX: TOTAL HIP ARTHROPLASTY: SHX124

## 2018-06-12 LAB — ABO/RH: ABO/RH(D): A POS

## 2018-06-12 SURGERY — ARTHROPLASTY, HIP, TOTAL, ANTERIOR APPROACH
Anesthesia: Spinal | Laterality: Left

## 2018-06-12 MED ORDER — SIMVASTATIN 20 MG PO TABS
40.0000 mg | ORAL_TABLET | Freq: Every evening | ORAL | Status: DC
  Administered 2018-06-12 – 2018-06-13 (×2): 40 mg via ORAL
  Filled 2018-06-12 (×2): qty 2

## 2018-06-12 MED ORDER — LACTATED RINGERS IV SOLN
INTRAVENOUS | Status: DC
  Administered 2018-06-12: 07:00:00 via INTRAVENOUS

## 2018-06-12 MED ORDER — HYDROCODONE-ACETAMINOPHEN 5-325 MG PO TABS
1.0000 | ORAL_TABLET | ORAL | Status: DC | PRN
  Administered 2018-06-12 – 2018-06-13 (×3): 1 via ORAL
  Filled 2018-06-12 (×5): qty 1

## 2018-06-12 MED ORDER — ONDANSETRON HCL 4 MG/2ML IJ SOLN: 4.0000 mg | Freq: Once | INTRAMUSCULAR | Status: DC | PRN

## 2018-06-12 MED ORDER — SODIUM CHLORIDE 0.9 % IV SOLN
INTRAVENOUS | Status: DC | PRN
  Administered 2018-06-12: 40 ug/min via INTRAVENOUS

## 2018-06-12 MED ORDER — BISACODYL 5 MG PO TBEC: 5.0000 mg | DELAYED_RELEASE_TABLET | Freq: Every day | ORAL | Status: DC | PRN

## 2018-06-12 MED ORDER — METHOCARBAMOL 1000 MG/10ML IJ SOLN
500.0000 mg | Freq: Four times a day (QID) | INTRAVENOUS | Status: DC | PRN
  Filled 2018-06-12: qty 5

## 2018-06-12 MED ORDER — BUPIVACAINE-EPINEPHRINE (PF) 0.25% -1:200000 IJ SOLN
INTRAMUSCULAR | Status: AC
  Filled 2018-06-12: qty 30

## 2018-06-12 MED ORDER — ONDANSETRON HCL 4 MG/2ML IJ SOLN
INTRAMUSCULAR | Status: DC | PRN
  Administered 2018-06-12: 4 mg via INTRAVENOUS

## 2018-06-12 MED ORDER — MIDAZOLAM HCL 2 MG/2ML IJ SOLN
INTRAMUSCULAR | Status: AC
  Filled 2018-06-12: qty 2

## 2018-06-12 MED ORDER — VITAMIN D 1000 UNITS PO TABS
1000.0000 [IU] | ORAL_TABLET | Freq: Every day | ORAL | Status: DC
  Administered 2018-06-13 – 2018-06-14 (×2): 1000 [IU] via ORAL
  Filled 2018-06-12 (×2): qty 1

## 2018-06-12 MED ORDER — SODIUM CHLORIDE 0.9 % IV SOLN
INTRAVENOUS | Status: DC
  Administered 2018-06-12 (×2): via INTRAVENOUS

## 2018-06-12 MED ORDER — PHENYLEPHRINE HCL 10 MG/ML IJ SOLN
INTRAMUSCULAR | Status: AC
  Filled 2018-06-12: qty 1

## 2018-06-12 MED ORDER — BUPIVACAINE HCL (PF) 0.5 % IJ SOLN
INTRAMUSCULAR | Status: DC | PRN
  Administered 2018-06-12: 3 mL

## 2018-06-12 MED ORDER — FENTANYL CITRATE (PF) 100 MCG/2ML IJ SOLN
INTRAMUSCULAR | Status: AC
  Filled 2018-06-12: qty 2

## 2018-06-12 MED ORDER — PROPOFOL 500 MG/50ML IV EMUL
INTRAVENOUS | Status: DC | PRN
  Administered 2018-06-12: 100 ug/kg/min via INTRAVENOUS

## 2018-06-12 MED ORDER — FENTANYL CITRATE (PF) 100 MCG/2ML IJ SOLN: 25.0000 ug | INTRAMUSCULAR | Status: DC | PRN

## 2018-06-12 MED ORDER — DIPHENHYDRAMINE HCL 12.5 MG/5ML PO ELIX: 12.5000 mg | ORAL_SOLUTION | ORAL | Status: DC | PRN

## 2018-06-12 MED ORDER — NEOMYCIN-POLYMYXIN B GU 40-200000 IR SOLN
Status: AC
  Filled 2018-06-12: qty 1

## 2018-06-12 MED ORDER — DOCUSATE SODIUM 100 MG PO CAPS
100.0000 mg | ORAL_CAPSULE | Freq: Two times a day (BID) | ORAL | Status: DC
  Administered 2018-06-12 – 2018-06-14 (×4): 100 mg via ORAL
  Filled 2018-06-12 (×4): qty 1

## 2018-06-12 MED ORDER — HYDROCHLOROTHIAZIDE 25 MG PO TABS
25.0000 mg | ORAL_TABLET | Freq: Every day | ORAL | Status: DC
  Administered 2018-06-13: 25 mg via ORAL
  Filled 2018-06-12: qty 1

## 2018-06-12 MED ORDER — ONDANSETRON HCL 4 MG/2ML IJ SOLN: 4.0000 mg | Freq: Four times a day (QID) | INTRAMUSCULAR | Status: DC | PRN

## 2018-06-12 MED ORDER — HYDROCODONE-ACETAMINOPHEN 7.5-325 MG PO TABS: 1.0000 | ORAL_TABLET | ORAL | Status: DC | PRN

## 2018-06-12 MED ORDER — ALUM & MAG HYDROXIDE-SIMETH 200-200-20 MG/5ML PO SUSP: 30.0000 mL | ORAL | Status: DC | PRN

## 2018-06-12 MED ORDER — EPHEDRINE SULFATE 50 MG/ML IJ SOLN
INTRAMUSCULAR | Status: AC
  Filled 2018-06-12: qty 1

## 2018-06-12 MED ORDER — CEFAZOLIN SODIUM-DEXTROSE 2-4 GM/100ML-% IV SOLN
INTRAVENOUS | Status: AC
  Filled 2018-06-12: qty 100

## 2018-06-12 MED ORDER — ONDANSETRON HCL 4 MG/2ML IJ SOLN
INTRAMUSCULAR | Status: AC
  Filled 2018-06-12: qty 2

## 2018-06-12 MED ORDER — BUPIVACAINE HCL (PF) 0.5 % IJ SOLN
INTRAMUSCULAR | Status: AC
  Filled 2018-06-12: qty 10

## 2018-06-12 MED ORDER — DEXAMETHASONE SODIUM PHOSPHATE 10 MG/ML IJ SOLN
INTRAMUSCULAR | Status: AC
  Filled 2018-06-12: qty 1

## 2018-06-12 MED ORDER — TRAMADOL HCL 50 MG PO TABS
50.0000 mg | ORAL_TABLET | Freq: Four times a day (QID) | ORAL | Status: DC
  Administered 2018-06-12 – 2018-06-14 (×9): 50 mg via ORAL
  Filled 2018-06-12 (×9): qty 1

## 2018-06-12 MED ORDER — SCOPOLAMINE 1 MG/3DAYS TD PT72
1.0000 | MEDICATED_PATCH | Freq: Once | TRANSDERMAL | Status: DC
  Administered 2018-06-12: 1.5 mg via TRANSDERMAL

## 2018-06-12 MED ORDER — LEVOTHYROXINE SODIUM 25 MCG PO TABS
125.0000 ug | ORAL_TABLET | Freq: Every day | ORAL | Status: DC
  Administered 2018-06-13 – 2018-06-14 (×2): 125 ug via ORAL
  Filled 2018-06-12 (×2): qty 1

## 2018-06-12 MED ORDER — FAMOTIDINE 20 MG PO TABS
20.0000 mg | ORAL_TABLET | Freq: Once | ORAL | Status: AC
  Administered 2018-06-12: 20 mg via ORAL

## 2018-06-12 MED ORDER — FAMOTIDINE 20 MG PO TABS
20.0000 mg | ORAL_TABLET | Freq: Every day | ORAL | Status: DC
  Administered 2018-06-13 – 2018-06-14 (×2): 20 mg via ORAL
  Filled 2018-06-12 (×2): qty 1

## 2018-06-12 MED ORDER — PHENOL 1.4 % MT LIQD
1.0000 | OROMUCOSAL | Status: DC | PRN
  Filled 2018-06-12: qty 177

## 2018-06-12 MED ORDER — EPHEDRINE SULFATE 50 MG/ML IJ SOLN
INTRAMUSCULAR | Status: DC | PRN
  Administered 2018-06-12 (×5): 10 mg via INTRAVENOUS

## 2018-06-12 MED ORDER — METOCLOPRAMIDE HCL 10 MG PO TABS: 5.0000 mg | ORAL_TABLET | Freq: Three times a day (TID) | ORAL | Status: DC | PRN

## 2018-06-12 MED ORDER — ACETAMINOPHEN 500 MG PO TABS
500.0000 mg | ORAL_TABLET | Freq: Four times a day (QID) | ORAL | Status: AC
  Administered 2018-06-12 – 2018-06-13 (×4): 500 mg via ORAL
  Filled 2018-06-12 (×4): qty 1

## 2018-06-12 MED ORDER — DEXAMETHASONE SODIUM PHOSPHATE 10 MG/ML IJ SOLN
INTRAMUSCULAR | Status: DC | PRN
  Administered 2018-06-12: 8 mg via INTRAVENOUS

## 2018-06-12 MED ORDER — METHOCARBAMOL 500 MG PO TABS: 500.0000 mg | ORAL_TABLET | Freq: Four times a day (QID) | ORAL | Status: DC | PRN

## 2018-06-12 MED ORDER — LISINOPRIL 10 MG PO TABS
10.0000 mg | ORAL_TABLET | Freq: Every day | ORAL | Status: DC
  Filled 2018-06-12: qty 1

## 2018-06-12 MED ORDER — LORATADINE 10 MG PO TABS: 10.0000 mg | ORAL_TABLET | Freq: Every day | ORAL | Status: DC | PRN

## 2018-06-12 MED ORDER — ONDANSETRON HCL 4 MG PO TABS: 4.0000 mg | ORAL_TABLET | Freq: Four times a day (QID) | ORAL | Status: DC | PRN

## 2018-06-12 MED ORDER — NEOMYCIN-POLYMYXIN B GU 40-200000 IR SOLN
Status: DC | PRN
  Administered 2018-06-12: 4 mL

## 2018-06-12 MED ORDER — PROPOFOL 500 MG/50ML IV EMUL
INTRAVENOUS | Status: AC
  Filled 2018-06-12: qty 50

## 2018-06-12 MED ORDER — FENTANYL CITRATE (PF) 100 MCG/2ML IJ SOLN
INTRAMUSCULAR | Status: DC | PRN
  Administered 2018-06-12: 50 ug via INTRAVENOUS

## 2018-06-12 MED ORDER — MORPHINE SULFATE (PF) 2 MG/ML IV SOLN: 0.5000 mg | INTRAVENOUS | Status: DC | PRN

## 2018-06-12 MED ORDER — MIDAZOLAM HCL 5 MG/5ML IJ SOLN
INTRAMUSCULAR | Status: DC | PRN
  Administered 2018-06-12: 2 mg via INTRAVENOUS

## 2018-06-12 MED ORDER — VITAMIN B-12 1000 MCG PO TABS
1000.0000 ug | ORAL_TABLET | Freq: Every day | ORAL | Status: DC
  Administered 2018-06-13 – 2018-06-14 (×2): 1000 ug via ORAL
  Filled 2018-06-12 (×2): qty 1

## 2018-06-12 MED ORDER — PHENYLEPHRINE HCL 10 MG/ML IJ SOLN
INTRAMUSCULAR | Status: DC | PRN
  Administered 2018-06-12: 100 ug via INTRAVENOUS

## 2018-06-12 MED ORDER — SENNOSIDES-DOCUSATE SODIUM 8.6-50 MG PO TABS: 1.0000 | ORAL_TABLET | Freq: Every evening | ORAL | Status: DC | PRN

## 2018-06-12 MED ORDER — CEFAZOLIN SODIUM-DEXTROSE 2-4 GM/100ML-% IV SOLN
2.0000 g | Freq: Four times a day (QID) | INTRAVENOUS | Status: AC
  Administered 2018-06-12 – 2018-06-13 (×3): 2 g via INTRAVENOUS
  Filled 2018-06-12 (×3): qty 100

## 2018-06-12 MED ORDER — GABAPENTIN 300 MG PO CAPS
300.0000 mg | ORAL_CAPSULE | Freq: Three times a day (TID) | ORAL | Status: DC
  Administered 2018-06-12 – 2018-06-14 (×6): 300 mg via ORAL
  Filled 2018-06-12 (×6): qty 1

## 2018-06-12 MED ORDER — METOCLOPRAMIDE HCL 5 MG/ML IJ SOLN: 5.0000 mg | Freq: Three times a day (TID) | INTRAMUSCULAR | Status: DC | PRN

## 2018-06-12 MED ORDER — ZOLPIDEM TARTRATE 5 MG PO TABS: 5.0000 mg | ORAL_TABLET | Freq: Every evening | ORAL | Status: DC | PRN

## 2018-06-12 MED ORDER — FAMOTIDINE 20 MG PO TABS
ORAL_TABLET | ORAL | Status: AC
  Administered 2018-06-12: 20 mg via ORAL
  Filled 2018-06-12: qty 1

## 2018-06-12 MED ORDER — ASPIRIN EC 325 MG PO TBEC
325.0000 mg | DELAYED_RELEASE_TABLET | Freq: Every day | ORAL | Status: DC
  Administered 2018-06-13 – 2018-06-14 (×2): 325 mg via ORAL
  Filled 2018-06-12 (×2): qty 1

## 2018-06-12 MED ORDER — MENTHOL 3 MG MT LOZG
1.0000 | LOZENGE | OROMUCOSAL | Status: DC | PRN
  Filled 2018-06-12 (×2): qty 9

## 2018-06-12 MED ORDER — MAGNESIUM CITRATE PO SOLN
1.0000 | Freq: Once | ORAL | Status: DC | PRN
  Filled 2018-06-12: qty 296

## 2018-06-12 MED ORDER — SCOPOLAMINE 1 MG/3DAYS TD PT72
MEDICATED_PATCH | TRANSDERMAL | Status: AC
  Administered 2018-06-12: 1.5 mg via TRANSDERMAL
  Filled 2018-06-12: qty 1

## 2018-06-12 MED ORDER — ACETAMINOPHEN 325 MG PO TABS: 325.0000 mg | ORAL_TABLET | Freq: Four times a day (QID) | ORAL | Status: DC | PRN

## 2018-06-12 MED ORDER — BUPIVACAINE-EPINEPHRINE 0.25% -1:200000 IJ SOLN
INTRAMUSCULAR | Status: DC | PRN
  Administered 2018-06-12: 30 mL

## 2018-06-12 SURGICAL SUPPLY — 57 items
BLADE SAGITTAL AGGR TOOTH XLG (BLADE) ×2 IMPLANT
BNDG COHESIVE 6X5 TAN STRL LF (GAUZE/BANDAGES/DRESSINGS) ×6 IMPLANT
CANISTER SUCT 1200ML W/VALVE (MISCELLANEOUS) ×2 IMPLANT
CHLORAPREP W/TINT 26ML (MISCELLANEOUS) ×2 IMPLANT
DRAPE C-ARM XRAY 36X54 (DRAPES) ×2 IMPLANT
DRAPE INCISE IOBAN 66X60 STRL (DRAPES) IMPLANT
DRAPE POUCH INSTRU U-SHP 10X18 (DRAPES) ×2 IMPLANT
DRAPE SHEET LG 3/4 BI-LAMINATE (DRAPES) ×6 IMPLANT
DRAPE TABLE BACK 80X90 (DRAPES) ×2 IMPLANT
DRESSING SURGICEL FIBRLLR 1X2 (HEMOSTASIS) ×2 IMPLANT
DRSG OPSITE POSTOP 4X8 (GAUZE/BANDAGES/DRESSINGS) ×4 IMPLANT
DRSG SURGICEL FIBRILLAR 1X2 (HEMOSTASIS) ×4
ELECT BLADE 6.5 EXT (BLADE) ×2 IMPLANT
ELECT REM PT RETURN 9FT ADLT (ELECTROSURGICAL) ×2
ELECTRODE REM PT RTRN 9FT ADLT (ELECTROSURGICAL) ×1 IMPLANT
GLOVE BIOGEL PI IND STRL 9 (GLOVE) ×1 IMPLANT
GLOVE BIOGEL PI INDICATOR 9 (GLOVE) ×1
GLOVE SURG SYN 9.0  PF PI (GLOVE) ×2
GLOVE SURG SYN 9.0 PF PI (GLOVE) ×2 IMPLANT
GOWN SRG 2XL LVL 4 RGLN SLV (GOWNS) ×1 IMPLANT
GOWN STRL NON-REIN 2XL LVL4 (GOWNS) ×1
GOWN STRL REUS W/ TWL LRG LVL3 (GOWN DISPOSABLE) ×1 IMPLANT
GOWN STRL REUS W/TWL LRG LVL3 (GOWN DISPOSABLE) ×1
HEAD FEMORAL 28MM SZ S (Head) ×2 IMPLANT
HEMOVAC 400CC 10FR (MISCELLANEOUS) IMPLANT
HOLDER FOLEY CATH W/STRAP (MISCELLANEOUS) ×2 IMPLANT
HOOD PEEL AWAY FLYTE STAYCOOL (MISCELLANEOUS) ×2 IMPLANT
KIT PREVENA INCISION MGT 13 (CANNISTER) ×2 IMPLANT
LINER DUAL MOB 50MM (Liner) ×2 IMPLANT
MAT ABSORB  FLUID 56X50 GRAY (MISCELLANEOUS) ×1
MAT ABSORB FLUID 56X50 GRAY (MISCELLANEOUS) ×1 IMPLANT
NDL SAFETY ECLIPSE 18X1.5 (NEEDLE) ×1 IMPLANT
NEEDLE HYPO 18GX1.5 SHARP (NEEDLE) ×1
NEEDLE SPNL 18GX3.5 QUINCKE PK (NEEDLE) ×2 IMPLANT
NS IRRIG 1000ML POUR BTL (IV SOLUTION) ×2 IMPLANT
PACK HIP COMPR (MISCELLANEOUS) ×2 IMPLANT
SCALPEL PROTECTED #10 DISP (BLADE) ×4 IMPLANT
SHELL ACETABULAR SZ0 50 DME (Shell) ×2 IMPLANT
SOL PREP PVP 2OZ (MISCELLANEOUS) ×2
SOLUTION PREP PVP 2OZ (MISCELLANEOUS) ×1 IMPLANT
SPONGE DRAIN TRACH 4X4 STRL 2S (GAUZE/BANDAGES/DRESSINGS) ×2 IMPLANT
STAPLER SKIN PROX 35W (STAPLE) ×2 IMPLANT
STEM FEMORAL 1 STD COLLARED (Stem) ×2 IMPLANT
STRAP SAFETY 5IN WIDE (MISCELLANEOUS) ×2 IMPLANT
SUT DVC 2 QUILL PDO  T11 36X36 (SUTURE) ×1
SUT DVC 2 QUILL PDO T11 36X36 (SUTURE) ×1 IMPLANT
SUT SILK 0 (SUTURE) ×1
SUT SILK 0 30XBRD TIE 6 (SUTURE) ×1 IMPLANT
SUT V-LOC 90 ABS DVC 3-0 CL (SUTURE) ×2 IMPLANT
SUT VIC AB 1 CT1 36 (SUTURE) ×2 IMPLANT
SYR 20CC LL (SYRINGE) ×2 IMPLANT
SYR 30ML LL (SYRINGE) ×2 IMPLANT
SYR BULB IRRIG 60ML STRL (SYRINGE) ×2 IMPLANT
TAPE MICROFOAM 4IN (TAPE) ×2 IMPLANT
TOWEL OR 17X26 4PK STRL BLUE (TOWEL DISPOSABLE) ×2 IMPLANT
TRAY FOLEY MTR SLVR 16FR STAT (SET/KITS/TRAYS/PACK) ×2 IMPLANT
WND VAC CANISTER 500ML (MISCELLANEOUS) ×2 IMPLANT

## 2018-06-12 NOTE — Evaluation (Signed)
Physical Therapy Evaluation Patient Details Name: Breanna Hanson MRN: [id number] DOB: 1951-02-28 Today's Date: 06/12/2018   History of Present Illness  Pt presents to hospital on 06/12/18 for an elective L THR performed by Dr. Rosita KeaMenz. Pt has a past medical history that includes GERD, HLD, HTN, hypothyroidism, and a R knee subchondroplasty with partial medial menisectomy.    Clinical Impression  Pt is a pleasant 67 year old female who was admitted for a L THR performed by Dr. Rosita KeaMenz. Pt performs bed mobility, transfers, and ambulation with CGA and a RW. Pt demonstrates deficits with strength, ROM, and mobility. Pt sensation WNL on B LE. Pt  able to perform AROM of all LE motions including extension of toes. Pt states that pain was 3/10 at the onset of therapy and remained controlled throughout. Pt was transferred with CGA assistance sit>stand at Rw. Pt amb 3' using walker then transferred stand>sit in chair at bedside. Pt complains of slight lightheadedness with change in position that quickly subsides. Pt was instructed verbally and through demonstration HEP. Pt educated on anterior hip precautions and verbalizes understanding. Pt tolerated treatment well. Pt would benefit from continued skilled therapy to improve toward PLOF. PT will continue to work with pt BID during admission. At this time PT recommends d/c home with HHPT.     Follow Up Recommendations Home health PT    Equipment Recommendations  3in1 (PT)    Recommendations for Other Services       Precautions / Restrictions Precautions Precautions: Anterior Hip Precaution Booklet Issued: Yes (comment) Precaution Comments: given precaution and ther-ex packet, reviewed precautions plan to review ther-ex at later date Restrictions Weight Bearing Restrictions: Yes LLE Weight Bearing: Weight bearing as tolerated      Mobility  Bed Mobility Overal bed mobility: Needs Assistance Bed Mobility: Supine to Sit     Supine to sit: Min guard      General bed mobility comments: Pt performs supine<>sit with CGA for L LE however positions self well and demonstrates appropriate use of UEs  Transfers Overall transfer level: Needs assistance Equipment used: Rolling walker (2 wheeled) Transfers: Sit to/from Stand Sit to Stand: Min guard         General transfer comment: Pt transfers sit<>stand at RW with CGA. Pt demonstrates safe use of UEs and RW and has no gross LOB during transfer  Ambulation/Gait Ambulation/Gait assistance: Min guard Gait Distance (Feet): 3 Feet Assistive device: Rolling walker (2 wheeled) Gait Pattern/deviations: Shuffle     General Gait Details: After verbal cuing and safe completion of pre gait exercises (weight shifting and marching) pt amb 3' to chair beside bed with CGA and RW demonstrating a slow shuffling gait pattern however accepts weight onto L LE well and performs without LOB  Stairs            Wheelchair Mobility    Modified Rankin (Stroke Patients Only)       Balance Overall balance assessment: Needs assistance   Sitting balance-Leahy Scale: Good Sitting balance - Comments: Pt sits EOB with B feet flat on floor and no UE support without LOB     Standing balance-Leahy Scale: Good Standing balance comment: Pt benefits from B UE support on RW without gross LOB not formally assessed but due to ease of task pt could likely perform for short periods without UE support  Pertinent Vitals/Pain Pain Assessment: 0-10 Pain Score: 3 (3/10 at rest elevates to 4/10 with activity) Pain Location: L hip  Pain Descriptors / Indicators: Dull;Throbbing Pain Intervention(s): Limited activity within patient's tolerance;Monitored during session    Home Living Family/patient expects to be discharged to:: Private residence Living Arrangements: Spouse/significant other Available Help at Discharge: Family;Available 24 hours/day Type of Home: House Home  Access: Stairs to enter Entrance Stairs-Rails: None Entrance Stairs-Number of Steps: 2 Home Layout: One level Home Equipment: Walker - 2 wheels;Cane - single point      Prior Function Level of Independence: Independent with assistive device(s)         Comments: Pt independent with SPC, able to traverse community distances prior to surgery. Pt reports history of one fall in which she slipped in the rain however was able to get up independently and was uninjured     Hand Dominance        Extremity/Trunk Assessment   Upper Extremity Assessment Upper Extremity Assessment: Overall WFL for tasks assessed(Grossly at least 4+/5)    Lower Extremity Assessment Lower Extremity Assessment: RLE deficits/detail;LLE deficits/detail RLE Deficits / Details: Grossly at least 4/5 assessed during functional activities RLE Sensation: WNL LLE Deficits / Details: Unable to full assess due to restrictions and pain. Grossly at least 3+/5 assessed during ther-ex and functional activity LLE Sensation: WNL       Communication   Communication: No difficulties  Cognition Arousal/Alertness: Awake/alert Behavior During Therapy: WFL for tasks assessed/performed Overall Cognitive Status: Within Functional Limits for tasks assessed                                        General Comments      Exercises Other Exercises Other Exercises: Pt instructed in and tolerates well L LE ther-ex x10 reps including ankle pumps, quad sets, glut sets, SLR, and hip abd. SLR and hip abd required min assist to initiate motion that decreased to CGA as exercise progressed   Assessment/Plan    PT Assessment Patient needs continued PT services  PT Problem List Decreased strength;Decreased range of motion;Decreased activity tolerance;Decreased balance;Decreased mobility;Decreased coordination;Decreased knowledge of use of DME;Pain;Decreased knowledge of precautions       PT Treatment Interventions DME  instruction;Gait training;Stair training;Functional mobility training;Therapeutic activities;Therapeutic exercise;Balance training;Patient/family education    PT Goals (Current goals can be found in the Care Plan section)  Acute Rehab PT Goals Patient Stated Goal: to get moving and return to PLOF PT Goal Formulation: With patient Time For Goal Achievement: 06/26/18 Potential to Achieve Goals: Good    Frequency BID   Barriers to discharge        Co-evaluation               AM-PAC PT "6 Clicks" Daily Activity  Outcome Measure Difficulty turning over in bed (including adjusting bedclothes, sheets and blankets)?: A Little Difficulty moving from lying on back to sitting on the side of the bed? : Unable Difficulty sitting down on and standing up from a chair with arms (e.g., wheelchair, bedside commode, etc,.)?: Unable Help needed moving to and from a bed to chair (including a wheelchair)?: A Little Help needed walking in hospital room?: A Little Help needed climbing 3-5 steps with a railing? : A Lot 6 Click Score: 13    End of Session Equipment Utilized During Treatment: Gait belt Activity Tolerance: Patient tolerated treatment well Patient left:  in chair;with call bell/phone within reach;with family/visitor present Nurse Communication: Mobility status PT Visit Diagnosis: Unsteadiness on feet (R26.81);Other abnormalities of gait and mobility (R26.89);Muscle weakness (generalized) (M62.81);History of falling (Z91.81);Difficulty in walking, not elsewhere classified (R26.2);Pain Pain - Right/Left: Left Pain - part of body: Hip    Time: 6962-9528 PT Time Calculation (min) (ACUTE ONLY): 31 min   Charges:              Ron Agee, SPT   Daisy Lites 06/12/2018, 5:10 PM

## 2018-06-12 NOTE — Anesthesia Procedure Notes (Signed)
Spinal  Start time: 06/12/2018 7:27 AM End time: 06/12/2018 7:27 AM Staffing Anesthesiologist: Yves Dillarroll, Paul, MD Resident/CRNA: Mathews ArgyleLogan, Daphne Karrer, CRNA Other anesthesia staff: Uvaldo RisingKemmling, Aaron L, RN Performed: other anesthesia staff  Preanesthetic Checklist Completed: patient identified, site marked, surgical consent, pre-op evaluation, timeout performed, IV checked, risks and benefits discussed and monitors and equipment checked Spinal Block Patient position: sitting Prep: ChloraPrep Patient monitoring: heart rate, cardiac monitor, continuous pulse ox and blood pressure Approach: midline Location: L3-4 Injection technique: single-shot Needle Needle type: Pencan  Needle gauge: 24 G Needle length: 10 cm Assessment Sensory level: T6

## 2018-06-12 NOTE — Transfer of Care (Signed)
Immediate Anesthesia Transfer of Care Note  Patient: Breanna Hanson  Procedure(s) Performed: TOTAL HIP ARTHROPLASTY ANTERIOR APPROACH (Left )  Patient Location: PACU  Anesthesia Type:Spinal  Level of Consciousness: awake, alert  and oriented  Airway & Oxygen Therapy: Patient Spontanous Breathing  Post-op Assessment: Report given to RN and Post -op Vital signs reviewed and stable  Post vital signs: Reviewed  Last Vitals:  Vitals Value Taken Time  BP 84/52 06/12/2018  8:57 AM  Temp    Pulse 76 06/12/2018  8:57 AM  Resp 19 06/12/2018  8:57 AM  SpO2 100 % 06/12/2018  8:57 AM  Vitals shown include unvalidated device data.  Last Pain:  Vitals:   06/12/18 0618  TempSrc: Tympanic  PainSc: 0-No pain         Complications: No apparent anesthesia complications

## 2018-06-12 NOTE — Anesthesia Procedure Notes (Signed)
Spinal

## 2018-06-12 NOTE — Anesthesia Post-op Follow-up Note (Signed)
Anesthesia QCDR form completed.        

## 2018-06-12 NOTE — Op Note (Signed)
06/12/2018  9:03 AM  PATIENT:  Breanna Hanson  67 y.o. female  PRE-OPERATIVE DIAGNOSIS:  PRIMARY OSTEOARTHRITIS OF LEFT HIP  POST-OPERATIVE DIAGNOSIS:  PRIMARY OSTEOARTHRITIS OF LEFT HIP  PROCEDURE:  Procedure(s): TOTAL HIP ARTHROPLASTY ANTERIOR APPROACH (Left)  SURGEON: Leitha SchullerMichael J Cathie Bonnell, MD  ASSISTANTS: none  ANESTHESIA:   spinal  EBL:  Total I/O In: 1000 [I.V.:1000] Out: 700 [Urine:300; Blood:400]  BLOOD ADMINISTERED:none  DRAINS: (2) Hemovact drain(s) in the subcutaneious layer with  Suction Open   LOCAL MEDICATIONS USED:  MARCAINE     SPECIMEN:  Source of Specimen:  Left femoral head  DISPOSITION OF SPECIMEN:  PATHOLOGY  COUNTS:  YES  TOURNIQUET:  * No tourniquets in log *  IMPLANTS: Medacta AMIS 1 standard stem with 50 mm Mpact DM cup and liner, any 8 mm S ceramic head  DICTATION: .Dragon Dictation   The patient was brought to the operating room and after spinal anesthesia was obtained patient was placed on the operative table with the ipsilateral foot into the Medacta attachment, contralateral leg on a well-padded table. C-arm was brought in and preop template x-Haros taken. After prepping and draping in usual sterile fashion appropriate patient identification and timeout procedures were completed. Anterior approach to the hip was obtained and centered over the greater trochanter and TFL muscle. The subcutaneous tissue was incised hemostasis being achieved by electrocautery. TFL fascia was incised and the muscle retracted laterally deep retractor placed. The lateral femoral circumflex vessels were identified and ligated. The anterior capsule was exposed and a capsulotomy performed. The neck was identified and a femoral neck cut carried out with a saw. The head was removed without difficulty and showed sclerotic femoral head and acetabulum. Reaming was carried out to 50 mm and a 50 mm cup trial gave appropriate tightness to the acetabular component a 50 DM cup was impacted into  position. The leg was then externally rotated and ischiofemoral and pubfemoral releases carried out. The femur was sequentially broached to a size 1, size 1 standard with S head trials were placed and the final components chosen. The one standard stem was inserted along with a ceramic S 28 mm head and 50 mm liner. The hip was reduced and was stable the wound was thoroughly irrigated with fibrillar placed along the posterior capsulotomy and medial neck. The deep fascia closed using a heavy Quill after infiltration of 30 cc of quarter percent Sensorcaine with epinephrine. Subcutaneous drains were then inserted, 3-0 v-loc to close the skin with skin staples and incisional wound VAC over the incision.  PLAN OF CARE: Admit to inpatient

## 2018-06-12 NOTE — Anesthesia Preprocedure Evaluation (Addendum)
Anesthesia Evaluation  Patient identified by MRN, date of birth, ID band Patient awake    Reviewed: Allergy & Precautions, NPO status , Patient's Chart, lab work & pertinent test results  History of Anesthesia Complications (+) PONV and history of anesthetic complications  Airway Mallampati: III  TM Distance: <3 FB     Dental   Pulmonary neg pulmonary ROS,    Pulmonary exam normal        Cardiovascular hypertension, Normal cardiovascular exam     Neuro/Psych negative neurological ROS  negative psych ROS   GI/Hepatic Neg liver ROS, GERD  Medicated,  Endo/Other  Hypothyroidism   Renal/GU negative Renal ROS  negative genitourinary   Musculoskeletal  (+) Arthritis ,   Abdominal Normal abdominal exam  (+)   Peds negative pediatric ROS (+)  Hematology negative hematology ROS (+)   Anesthesia Other Findings   Reproductive/Obstetrics                            Anesthesia Physical Anesthesia Plan  ASA: II  Anesthesia Plan: Spinal   Post-op Pain Management:    Induction: Intravenous  PONV Risk Score and Plan:   Airway Management Planned: Nasal Cannula  Additional Equipment:   Intra-op Plan:   Post-operative Plan:   Informed Consent: I have reviewed the patients History and Physical, chart, labs and discussed the procedure including the risks, benefits and alternatives for the proposed anesthesia with the patient or authorized representative who has indicated his/her understanding and acceptance.   Dental advisory given  Plan Discussed with: CRNA and Surgeon  Anesthesia Plan Comments:         Anesthesia Quick Evaluation

## 2018-06-12 NOTE — NC FL2 (Signed)
  [place] MEDICAID FL2 LEVEL OF CARE SCREENING TOOL     IDENTIFICATION  Patient Name: Breanna Hanson Birthdate: Aug 04, 1951 Sex: female Admission Date (Current Location): 06/12/2018  [place]   Facility and Address:  [facility], [address], [place], [place] [address]      Provider Number: [id number]  Attending Physician Name and Address:  Kennedy BuckerMenz, Michael, MD  Relative Name and Phone Number:       Current Level of Care: Hospital Recommended Level of Care: Skilled Nursing Facility Prior Approval Number:    Date Approved/Denied:   PASRR Number: ([id number] [id number])  Discharge Plan: SNF    Current Diagnoses: Patient Active Problem List   Diagnosis Date Noted  . Status post total hip replacement, left 06/12/2018    Orientation RESPIRATION BLADDER Height & Weight     Self, Time, Situation, Place  Normal Continent Weight: 164 lb 14.4 oz (74.8 kg) Height:  5\' 4"  (162.6 cm)  BEHAVIORAL SYMPTOMS/MOOD NEUROLOGICAL BOWEL NUTRITION STATUS      Continent Diet(Diet: Regular )  AMBULATORY STATUS COMMUNICATION OF NEEDS Skin   Extensive Assist Verbally Surgical wounds(Incision: Left Hip. )                       Personal Care Assistance Level of Assistance  Bathing, Feeding, Dressing Bathing Assistance: Limited assistance Feeding assistance: Independent Dressing Assistance: Limited assistance     Functional Limitations Info  Sight, Hearing, Speech Sight Info: Adequate Hearing Info: Adequate Speech Info: Adequate    SPECIAL CARE FACTORS FREQUENCY  PT (By licensed PT), OT (By licensed OT)     PT Frequency: (5) OT Frequency: (5)            Contractures      Additional Factors Info  Code Status, Allergies Code Status Info: (not on file. ) Allergies Info: (No Known Allergies. )           Current Medications (06/12/2018):  This is the current hospital active medication list Current Facility-Administered  Medications  Medication Dose Route Frequency Provider Last Rate Last Dose  . fentaNYL (SUBLIMAZE) injection 25 mcg  25 mcg Intravenous Q5 min PRN Yves Dillarroll, Paul, MD      . lactated ringers infusion   Intravenous Continuous Lenard SimmerKarenz, Andrew, MD 50 mL/hr at 06/12/18 [phone]    . ondansetron (ZOFRAN) injection 4 mg  4 mg Intravenous Once PRN Yves Dillarroll, Paul, MD      . scopolamine (TRANSDERM-SCOP) 1 MG/3DAYS 1.5 mg  1 patch Transdermal Once Lenard SimmerKarenz, Andrew, MD   1.5 mg at 06/12/18 [phone]     Discharge Medications: Please see discharge summary for a list of discharge medications.  Relevant Imaging Results:  Relevant Lab Results:   Additional Information (SSN: [id number])  Deaisa Merida, Darleen CrockerBailey M, LCSW

## 2018-06-12 NOTE — H&P (Addendum)
Chief Complaint: Left hip pain  History of the Present Illness: Breanna Hanson is a 67 y.o. female here for history and physical for left total hip arthroplasty with Dr. Rosita KeaMenz on 06/12/2017.  Patient's pain is 5 out of 10.  She has moderate to severe left hip osteoarthritis seen on x-rays from February 2019.  She said progressive left hip pain located in her groin, buttocks and anterior thigh of last 8 months.  She has been taking Aleve as well as Tylenol with only minimal relief.  Patient's pain is interfering with quality life and activities daily living.  She is been using a cane.  Patient has had a cortisone injection with no relief.  She works primarily at a desk-type job.  I have reviewed past medical, surgical, social and family history, and allergies as documented in the EMR.   Past Medical History:     Past Medical History:  Diagnosis Date  . GERD (gastroesophageal reflux disease)   . History of solitary pulmonary nodule    thought to be bronchogenic cyst, unchanged in size since initial documentation in 1980's per patient.  . Hyperlipidemia   . Hypertension   . Hypothyroidism, unspecified   . Vitamin B12 deficiency 02/18/2016    Past Surgical History:      Past Surgical History:  Procedure Laterality Date  . CESAREAN SECTION    . CHOLECYSTECTOMY  08/1998  . COLONOSCOPY  01/22/2007   Dr. Eber HongM. Skulskie @ [facility] - Diverticulosis, FHPolyps(f)  . COLONOSCOPY  08/14/2017   Diverticulosis/FHx CP-Father/Repeat 4863yrs/MUS  . knee arthroscopy with subchondroplasty with partial medial menisectomy  Right 03/22/2018   Dr.Fleur Audino    Past Family History: FamilyHistory       Family History  Problem Relation Age of Onset  . Coronary Artery Disease (Blocked arteries around heart) Mother   . Diabetes Mother   . Colon polyps Father       Medications:       Current Outpatient Medications Ordered in Epic  Medication Sig Dispense Refill  . acetaminophen (TYLENOL)  500 MG tablet Take 1,000 mg by mouth as needed for Pain    . ciprofloxacin HCl (CIPRO) 500 MG tablet Take 1 tablet (500 mg total) by mouth 2 (two) times daily 10 tablet 0  . cyanocobalamin (VITAMIN B12) 1000 MCG tablet Take 1,000 mcg by mouth once daily.    . hydroCHLOROthiazide (HYDRODIURIL) 25 MG tablet TAKE 1 TABLET BY MOUTH EVERY DAY 90 tablet 3  . ibuprofen (ADVIL,MOTRIN) 200 MG tablet Take 400 mg by mouth as needed for Pain    . KRILL OIL ORAL Take 500 mg by mouth 2 (two) times daily    . levothyroxine (SYNTHROID, LEVOTHROID) 125 MCG tablet TAKE 1 TABLET BY MOUTH EVERY DAY 90 tablet 3  . lisinopril (PRINIVIL,ZESTRIL) 10 MG tablet Take 1 tablet (10 mg total) by mouth once daily 30 tablet 11  . meloxicam (MOBIC) 7.5 MG tablet Take 1 tablet (7.5 mg total) by mouth once daily 30 tablet 5  . multivitamin tablet Take 1 tablet by mouth once daily.    . simvastatin (ZOCOR) 40 MG tablet TAKE 1 TABLET BY MOUTH EVERY NIGHT FOR CHOLESTEROL 90 tablet 2   No current Epic-ordered facility-administered medications on file.     Allergies:      Allergies  Allergen Reactions  . Fish Oil Other (See Comments)    dyspepsia     There is no height or weight on file to calculate BMI.   Review of  Systems: A comprehensive 14 point ROS was performed, reviewed, and the pertinent orthopaedic findings are documented in the HPI.   There were no vitals filed for this visit.  General Physical Examination:  General:  Well developed, well nourished, no apparent distress, normal affect, antalgic gait with a cane  HEENT: Head normocephalic, atraumatic, PERRL.   Abdomen: Soft, non tender, non distended, Bowel sounds present.  Heart: Examination of the heart reveals regular, rate, and rhythm.  There is no murmur noted on ascultation.  There is a normal apical pulse.  Lungs: Lungs are clear to auscultation.  There is no wheeze, rhonchi, or crackles.  There is normal expansion of  bilateral chest walls.     Musculoskeletal Examination: On exam, the patient has right leg internal rotation to 40 degrees and external rotation to 25 degrees.  Left leg internal rotation is 20 degrees and external rotation is 5 degrees with pain.  She has a 5 degree flexion contracture on the left.   Radiographs: Her prior x-rays were reviewed by me today, x-rays show degenerative changes in the left hip joint, moderate to severe in the superior and central acetabulum with significant subchondral changes noted.  Mild spurring noted.   Assessment:   ICD-10-CM ICD-9-CM  1. Primary osteoarthritis of left hip M16.12 715.15     Plan: 1.  Risks, benefits, complications of a left total hip arthroplasty were discussed with the patient.  Patient has agreed and consented to procedure with Dr. Rosita KeaMenz on 06/12/2018.  Reviewed paper H+P, will be scanned into chart. No changes noted.

## 2018-06-13 LAB — CBC
HCT: 32.1 % — ABNORMAL LOW (ref 35.0–47.0)
HEMOGLOBIN: 11.4 g/dL — AB (ref 12.0–16.0)
MCH: 32.4 pg (ref 26.0–34.0)
MCHC: 35.5 g/dL (ref 32.0–36.0)
MCV: 91.2 fL (ref 80.0–100.0)
Platelets: 154 10*3/uL (ref 150–440)
RBC: 3.52 MIL/uL — AB (ref 3.80–5.20)
RDW: 13.2 % (ref 11.5–14.5)
WBC: 10.6 10*3/uL (ref 3.6–11.0)

## 2018-06-13 LAB — BASIC METABOLIC PANEL
Anion gap: 5 (ref 5–15)
BUN: 11 mg/dL (ref 8–23)
CHLORIDE: 106 mmol/L (ref 98–111)
CO2: 27 mmol/L (ref 22–32)
CREATININE: 0.58 mg/dL (ref 0.44–1.00)
Calcium: 8.6 mg/dL — ABNORMAL LOW (ref 8.9–10.3)
GFR calc Af Amer: >60 mL/min (ref >60)
Glucose, Bld: 108 mg/dL — ABNORMAL HIGH (ref 70–99)
Potassium: 4.6 mmol/L (ref 3.5–5.1)
SODIUM: 138 mmol/L (ref 135–145)

## 2018-06-13 NOTE — Progress Notes (Signed)
Clinical Social Worker (CSW) received SNF consult. PT is recommending home health. RN case manager aware of above. Please reconsult if future social work needs arise. CSW signing off.   Paeton Studer, LCSW [phone] 

## 2018-06-13 NOTE — Progress Notes (Signed)
Physical Therapy Treatment Patient Details Name: Breanna Hanson MRN: [id number] DOB: 1951-08-28 Today's Date: 06/13/2018    History of Present Illness Pt presents to hospital on 06/12/18 for an elective L THR performed by Dr. Rosita KeaMenz. Pt has a past medical history that includes GERD, HLD, HTN, hypothyroidism, and a R knee subchondroplasty with partial medial menisectomy.    PT Comments    Pt seen this afternoon and states that she is doing well, she states that she has tolerated being up in the chair well since previous session and prefers to stay up in chair upon completion. Pt instructed in and tolerates there-ex well. Pt amb 200' with CGA and RW pt demonstrates improved gait pattern compared this morning requiring decreased verbal cuing. Pt states that she has 0/10 pain at rest however increases to 6/10 with activity. PT plans to attempt stair training next session to demonstrates safety entering and exiting home environment. Pt could benefit from continued skilled therapy at this time to improve deficits toward PLOF. PT will continue to work with pt BID while admitted. D/c recommendations continue to be home with HHPT.     Follow Up Recommendations  Home health PT     Equipment Recommendations  3in1 (PT)    Recommendations for Other Services       Precautions / Restrictions Precautions Precautions: Anterior Hip Precaution Booklet Issued: Yes (comment) Precaution Comments: given and reviewed precaution and ther-ex packet Restrictions Weight Bearing Restrictions: Yes LLE Weight Bearing: Weight bearing as tolerated    Mobility  Bed Mobility Overal bed mobility: Modified Independent Bed Mobility: Supine to Sit     Supine to sit: Modified independent (Device/Increase time)     General bed mobility comments: Not assessed this visit. Pt up in recliner and prefers to stay up upon completion  Transfers Overall transfer level: Modified independent Equipment used: Rolling walker (2  wheeled) Transfers: Sit to/from Stand Sit to Stand: Modified independent (Device/Increase time)         General transfer comment: Pt transfers sit<>stand at RW with modified independence requiring increased UE support and time  Ambulation/Gait Ambulation/Gait assistance: Min guard Gait Distance (Feet): 200 Feet Assistive device: Rolling walker (2 wheeled) Gait Pattern/deviations: Decreased stance time - left;Decreased step length - right;Step-through pattern     General Gait Details: Pt amb 200' total with CGA and RW. Pt demonstrates improved gait pattern this afternoon compared to this morning requiring much less verbal cuing to demonstrate appropriate stride length and use of RW   Stairs             Wheelchair Mobility    Modified Rankin (Stroke Patients Only)       Balance Overall balance assessment: Needs assistance Sitting-balance support: No upper extremity supported;Feet supported Sitting balance-Leahy Scale: Good     Standing balance support: Bilateral upper extremity supported Standing balance-Leahy Scale: Good                              Cognition Arousal/Alertness: Awake/alert Behavior During Therapy: WFL for tasks assessed/performed Overall Cognitive Status: Within Functional Limits for tasks assessed                                        Exercises Other Exercises Other Exercises: Pt instructed in and tolerates well L LE ther-ex x10 reps including ankle pumps, quad sets, glut  sets, SLR, abd, LAQ, seated and standing marching x12 reps each Other Exercises: Pt/spouse educated in falls prevention strategies including proper footwear and pet care considerations to minimize falls risk    General Comments        Pertinent Vitals/Pain Pain Assessment: 0-10 Pain Score: 6 (0/10 at rest elevates to 6/10 with activity) Pain Location: L hip with movement, 0/10 at rest Pain Descriptors / Indicators: Aching;Throbbing Pain  Intervention(s): Limited activity within patient's tolerance;Monitored during session;Repositioned    Home Living Family/patient expects to be discharged to:: Private residence Living Arrangements: Spouse/significant other Available Help at Discharge: Family;Available 24 hours/day Type of Home: House Home Access: Stairs to enter Entrance Stairs-Rails: None Home Layout: One level Home Equipment: Environmental consultantWalker - 2 wheels;Cane - single point;Shower seat - built in      Prior Function Level of Independence: Independent with assistive device(s)      Comments: Pt independent with SPC, able to traverse community distances prior to surgery. Pt reports history of one fall in which she slipped in the rain however was able to get up independently and was uninjured   PT Goals (current goals can now be found in the care plan section) Acute Rehab PT Goals Patient Stated Goal: to get moving and return to PLOF PT Goal Formulation: With patient Time For Goal Achievement: 06/26/18 Potential to Achieve Goals: Good Progress towards PT goals: Progressing toward goals    Frequency    BID      PT Plan Current plan remains appropriate    Co-evaluation              AM-PAC PT "6 Clicks" Daily Activity  Outcome Measure  Difficulty turning over in bed (including adjusting bedclothes, sheets and blankets)?: A Little Difficulty moving from lying on back to sitting on the side of the bed? : A Little Difficulty sitting down on and standing up from a chair with arms (e.g., wheelchair, bedside commode, etc,.)?: A Little Help needed moving to and from a bed to chair (including a wheelchair)?: A Little Help needed walking in hospital room?: A Little Help needed climbing 3-5 steps with a railing? : A Little 6 Click Score: 18    End of Session Equipment Utilized During Treatment: Gait belt Activity Tolerance: Patient tolerated treatment well Patient left: in chair;with call bell/phone within reach;with  family/visitor present Nurse Communication: Mobility status;Other (comment)(pain) PT Visit Diagnosis: Unsteadiness on feet (R26.81);Other abnormalities of gait and mobility (R26.89);Muscle weakness (generalized) (M62.81);History of falling (Z91.81);Difficulty in walking, not elsewhere classified (R26.2);Pain Pain - Right/Left: Left Pain - part of body: Hip     Time: 1255-1314 PT Time Calculation (min) (ACUTE ONLY): 19 min  Charges:  $Gait Training: 8-22 mins $Therapeutic Exercise: 8-22 mins                     CHS IncLogan Dashayla Theissen, SPT    Ensley Blas 06/13/2018, 2:29 PM

## 2018-06-13 NOTE — Evaluation (Signed)
Occupational Therapy Evaluation Patient Details Name: Breanna Hanson MRN: [id number] DOB: 1950/11/26 Today's Date: 06/13/2018    History of Present Illness Pt presents to hospital on 06/12/18 for an elective L THR performed by Dr. Rosita Kea. Pt has a past medical history that includes GERD, HLD, HTN, hypothyroidism, and a R knee subchondroplasty with partial medial menisectomy.   Clinical Impression   Pt seen for OT evaluation this date, POD#1 from above surgery. Pt was independent in all ADLs prior to surgery, however using a SPC for mobility due to L hip pain. Pt is eager to return to PLOF with less pain and improved safety and independence. Pt currently requires minimal assist for LB dressing and bathing while in seated position due to pain and limited AROM of L hip. Pt/spouse instructed in self care skills, falls prevention strategies including footwear and pet care considerations, home/routines modifications, DME/AE for LB bathing and dressing tasks, and compression stocking mgt strategies. Pt/spouse verbalized understanding. Pt politely declined to trial use of AE for LB dressing this date but agreeable during next session. Pt would benefit from additional instruction in self care skills and techniques to help maintain precautions with or without assistive devices to support recall and carryover prior to discharge. Do not anticipate any skilled OT needs at discharge.      Follow Up Recommendations  No OT follow up    Equipment Recommendations  3 in 1 bedside commode;Other (comment)(consider reacher, sock aide)    Recommendations for Other Services       Precautions / Restrictions Precautions Precautions: Anterior Hip Precaution Booklet Issued: Yes (comment) Precaution Comments: given and reviewed precaution and ther-ex packet Restrictions Weight Bearing Restrictions: Yes LLE Weight Bearing: Weight bearing as tolerated      Mobility Bed Mobility     General bed mobility comments:  deferred, up in recliner at start and end of session  Transfers Overall transfer level: Needs assistance Equipment used: Rolling walker (2 wheeled) Transfers: Sit to/from Stand Sit to Stand: Min guard         General transfer comment: Pt transfers sit<>stand at RW with CGA. Pt demonstrates safe use of UEs and RW and has no gross LOB during transfer    Balance Overall balance assessment: Needs assistance Sitting-balance support: No upper extremity supported;Feet supported Sitting balance-Leahy Scale: Good     Standing balance support: Bilateral upper extremity supported Standing balance-Leahy Scale: Good                             ADL either performed or assessed with clinical judgement   ADL Overall ADL's : Needs assistance/impaired             Lower Body Bathing: Sit to/from stand;Minimal assistance;With caregiver independent assisting Lower Body Bathing Details (indicate cue type and reason): pt/spouse instructed in seated shower once cleared by surgeon to maximize safety     Lower Body Dressing: Sit to/from stand;Minimal assistance;With caregiver independent assisting Lower Body Dressing Details (indicate cue type and reason): pt/spouse instructed in AE for LB dressing to improve independence as well as compression stocking mgt Toilet Transfer: RW;Min guard;Ambulation;BSC Toilet Transfer Details (indicate cue type and reason): pt/spouse educated in use of BSC vs toilet riser for toileting needs overnight and during the day. Pt interested in [facility] beside bed overnight and over commode during day to maximize safety and independence with toileting         Functional mobility during ADLs: Min  guard;Rolling walker       Vision Baseline Vision/History: Wears glasses Wears Glasses: At all times Patient Visual Report: No change from baseline       Perception     Praxis      Pertinent Vitals/Pain Pain Assessment: 0-10 Pain Score: 5  Pain Location: L  hip with movement, 0/10 at rest Pain Descriptors / Indicators: Aching;Throbbing Pain Intervention(s): Limited activity within patient's tolerance;Monitored during session;Premedicated before session;Repositioned     Hand Dominance     Extremity/Trunk Assessment Upper Extremity Assessment Upper Extremity Assessment: Overall WFL for tasks assessed   Lower Extremity Assessment Lower Extremity Assessment: Defer to PT evaluation   Cervical / Trunk Assessment Cervical / Trunk Assessment: Normal   Communication Communication Communication: No difficulties   Cognition Arousal/Alertness: Awake/alert Behavior During Therapy: WFL for tasks assessed/performed Overall Cognitive Status: Within Functional Limits for tasks assessed                                     General Comments       Exercises Exercises:  Other Exercises  Other Exercises: Pt/spouse educated in falls prevention strategies including proper footwear and pet care considerations to minimize falls risk   Shoulder Instructions      Home Living Family/patient expects to be discharged to:: Private residence Living Arrangements: Spouse/significant other Available Help at Discharge: Family;Available 24 hours/day Type of Home: House Home Access: Stairs to enter Entergy CorporationEntrance Stairs-Number of Steps: 2 Entrance Stairs-Rails: None Home Layout: One level     Bathroom Shower/Tub: Producer, television/film/videoWalk-in shower   Bathroom Toilet: Handicapped height     Home Equipment: Environmental consultantWalker - 2 wheels;Cane - single point;Shower seat - built in          Prior Functioning/Environment Level of Independence: Independent with assistive device(s)        Comments: Pt independent with SPC, able to traverse community distances prior to surgery. Pt reports history of one fall in which she slipped in the rain however was able to get up independently and was uninjured        OT Problem List: Decreased strength;Decreased knowledge of use of DME  or AE;Pain      OT Treatment/Interventions: Self-care/ADL training;Therapeutic exercise;Therapeutic activities;DME and/or AE instruction;Patient/family education    OT Goals(Current goals can be found in the care plan section) Acute Rehab OT Goals Patient Stated Goal: to get moving and return to PLOF OT Goal Formulation: With patient/family Time For Goal Achievement: 06/27/18 Potential to Achieve Goals: Good ADL Goals Pt Will Perform Lower Body Dressing: sit to/from stand;with modified independence;with adaptive equipment Pt Will Transfer to Toilet: with modified independence;ambulating(BSC over toilet, LRAD for ambulation) Additional ADL Goal #1: Pt will independently instruct spouse in compression stocking mgt including donning/doffing, wear schedule, and positioning to maximize safety during recovery.  OT Frequency: Min 1X/week   Barriers to D/C:            Co-evaluation              AM-PAC PT "6 Clicks" Daily Activity     Outcome Measure Help from another person eating meals?: None Help from another person taking care of personal grooming?: None Help from another person toileting, which includes using toliet, bedpan, or urinal?: A Little Help from another person bathing (including washing, rinsing, drying)?: A Little Help from another person to put on and taking off regular upper body clothing?: None Help from another  person to put on and taking off regular lower body clothing?: A Little 6 Click Score: 21   End of Session    Activity Tolerance: Patient tolerated treatment well Patient left: in chair;with call bell/phone within reach;with chair alarm set;with family/visitor present;with SCD's reapplied  OT Visit Diagnosis: Other abnormalities of gait and mobility (R26.89);Pain Pain - Right/Left: Left Pain - part of body: Hip                Time: [id number] OT Time Calculation (min): 24 min Charges:  OT General Charges $OT Visit: 1 Visit OT Evaluation $OT Eval Low  Complexity: 1 Low OT Treatments $Self Care/Home Management : 8-22 mins  Richrd PrimeJamie Stiller, MPH, MS, OTR/L ascom [phone] 06/13/18, 11:34 AM

## 2018-06-13 NOTE — Progress Notes (Signed)
Pt sitting in chair visiting with family. No change in patient from AM assessment. Pain tolerable. Rept given to Avery DennisonBrooke RN.

## 2018-06-13 NOTE — Progress Notes (Signed)
  Subjective: 1 Day Post-Op Procedure(s) (LRB): TOTAL HIP ARTHROPLASTY ANTERIOR APPROACH (Left) Patient reports pain as mild.   Patient is well, and has had no acute complaints or problems PT and care management to assist with discharge planning today. Negative for chest pain and shortness of breath Fever: no Gastrointestinal:Negative for nausea and vomiting  Objective: Vital signs in last 24 hours: Temp:  [97.1 F (36.2 C)-98.2 F (36.8 C)] 98.2 F (36.8 C) (08/21 0434) Pulse Rate:  [53-79] 53 (08/21 0434) Resp:  [14-21] 16 (08/21 0434) BP: (82-125)/(37-71) 107/64 (08/21 0434) SpO2:  [95 %-100 %] 99 % (08/21 0434)  Intake/Output from previous day:  Intake/Output Summary (Last 24 hours) at 06/13/2018 0811 Last data filed at 06/13/2018 0530 Gross per 24 hour  Intake 1387.8 ml  Output 2635 ml  Net -1247.2 ml    Intake/Output this shift: No intake/output data recorded.  Labs: Recent Labs    06/13/18 0455  HGB 11.4*   Recent Labs    06/13/18 0455  WBC 10.6  RBC 3.52*  HCT 32.1*  PLT 154   Recent Labs    06/13/18 0455  NA 138  K 4.6  CL 106  CO2 27  BUN 11  CREATININE 0.58  GLUCOSE 108*  CALCIUM 8.6*   No results for input(s): LABPT, INR in the last 72 hours.   EXAM General - Patient is Alert, Appropriate and Oriented Extremity - ABD soft Sensation intact distally Intact pulses distally Dorsiflexion/Plantar flexion intact Incision: dressing C/D/I No cellulitis present Dressing/Incision - Woundvac intact to the left hip with minimal drainage.  Hemovac intact to the left hip with mild bloody drainage. Motor Function - intact, moving foot and toes well on exam.  Abdomen soft with normal BS.  Past Medical History:  Diagnosis Date  . Arthritis   . Elevated lipids   . GERD (gastroesophageal reflux disease)   . Hypertension   . Hypothyroidism   . PONV (postoperative nausea and vomiting)   . Pulmonary nodule     Assessment/Plan: 1 Day Post-Op  Procedure(s) (LRB): TOTAL HIP ARTHROPLASTY ANTERIOR APPROACH (Left) Active Problems:   Status post total hip replacement, left  Estimated body mass index is 28.31 kg/m as calculated from the following:   Height as of this encounter: 5\' 4"  (1.626 m).   Weight as of this encounter: 74.8 kg. Advance diet Up with therapy D/C IV fluids when tolerating po intake.  Labs reviewed this AM.  Hg 11.4 Up with therapy today. Begin working on Be[employer] HomesBM Will remove Hemovac tomorrow. Possible discharge home tomorrow with HHPT.  DVT Prophylaxis - Aspirin, Foot Pumps and TED hose Weight-Bearing as tolerated to left leg  J. Horris LatinoLance McGhee, PA-C [facility] 06/13/2018, 8:11 AM

## 2018-06-13 NOTE — Progress Notes (Signed)
Physical Therapy Treatment Patient Details Name: Breanna Hanson MRN: [id number] DOB: 1951-06-17 Today's Date: 06/13/2018    History of Present Illness Pt presents to hospital on 06/12/18 for an elective L THR performed by Dr. Rosita KeaMenz. Pt has a past medical history that includes GERD, HLD, HTN, hypothyroidism, and a R knee subchondroplasty with partial medial menisectomy.    PT Comments    Pt seen this morning and states that she is doing well despite not getting much sleep last night. Pt states that her pain is 0/10 at rest, at worst during session is 8/10 immediately after transfer, and is consistently 5/10 with activity. Pt states that activity and motion appears to lessen pain. Pt demonstrates improvements noted through decreased assistance with bed mobility and ease with activities such as transfers and amb. Pt amb 200' with CGA, RW and chair follow demonstrating safe use of RW after verbal cuing with no LOB. Pt instructed in and tolerates there-ex well. Pt could benefit from continued skilled therapy at this time to improve deficits toward PLOF. PT will continue to work with pt BID while admitted. D/c recommendations continue to be home with HHPT.   Follow Up Recommendations  Home health PT     Equipment Recommendations  3in1 (PT)    Recommendations for Other Services       Precautions / Restrictions Precautions Precautions: Anterior Hip Precaution Booklet Issued: Yes (comment) Precaution Comments: given and reviewed precaution and ther-ex packet Restrictions Weight Bearing Restrictions: Yes LLE Weight Bearing: Weight bearing as tolerated    Mobility  Bed Mobility Overal bed mobility: Modified Independent Bed Mobility: Supine to Sit     Supine to sit: Modified independent (Device/Increase time)     General bed mobility comments: Pt performs bed mobility with mod I requiring increased UE support and effort to complete  Transfers Overall transfer level: Needs  assistance Equipment used: Rolling walker (2 wheeled) Transfers: Sit to/from Stand Sit to Stand: Min guard         General transfer comment: Pt transfers sit<>stand at RW with CGA. Pt demonstrates safe use of UEs and RW and has no gross LOB during transfer  Ambulation/Gait Ambulation/Gait assistance: Min guard Gait Distance (Feet): 200 Feet Assistive device: Rolling walker (2 wheeled) Gait Pattern/deviations: Decreased stance time - left;Decreased step length - right;Step-through pattern     General Gait Details: Pt amb 200' total with CGA, RW and chair follow. After much verbal cuing pt demonstrates safe use of RW and step through gait pattern however with decreased stride length on R and stance time on L   Stairs             Wheelchair Mobility    Modified Rankin (Stroke Patients Only)       Balance                                            Cognition Arousal/Alertness: Awake/alert Behavior During Therapy: WFL for tasks assessed/performed Overall Cognitive Status: Within Functional Limits for tasks assessed                                        Exercises Other Exercises Other Exercises: Pt instructed in and tolerates well L LE ther-ex x10 reps including ankle pumps, quad sets, glut sets, SLR, heel  slides, abd, LAQ, and seated marching x12 reps each    General Comments        Pertinent Vitals/Pain Pain Assessment: 0-10 Pain Score: 5 (0/10 at rest, 8/10 at worst, consistent 5/10 with activity) Pain Location: L hip  Pain Descriptors / Indicators: Dull;Throbbing Pain Intervention(s): Limited activity within patient's tolerance;Monitored during session;Repositioned    Home Living                      Prior Function            PT Goals (current goals can now be found in the care plan section) Acute Rehab PT Goals Patient Stated Goal: to get moving and return to PLOF PT Goal Formulation: With patient Time  For Goal Achievement: 06/26/18 Potential to Achieve Goals: Good Progress towards PT goals: Progressing toward goals    Frequency    BID      PT Plan Current plan remains appropriate    Co-evaluation              AM-PAC PT "6 Clicks" Daily Activity  Outcome Measure  Difficulty turning over in bed (including adjusting bedclothes, sheets and blankets)?: A Little Difficulty moving from lying on back to sitting on the side of the bed? : A Little Difficulty sitting down on and standing up from a chair with arms (e.g., wheelchair, bedside commode, etc,.)?: Unable Help needed moving to and from a bed to chair (including a wheelchair)?: A Little Help needed walking in hospital room?: A Little Help needed climbing 3-5 steps with a railing? : A Little 6 Click Score: 16    End of Session Equipment Utilized During Treatment: Gait belt Activity Tolerance: Patient tolerated treatment well Patient left: in chair;with call bell/phone within reach;with family/visitor present Nurse Communication: Mobility status;Other (comment)(pain) PT Visit Diagnosis: Unsteadiness on feet (R26.81);Other abnormalities of gait and mobility (R26.89);Muscle weakness (generalized) (M62.81);History of falling (Z91.81);Difficulty in walking, not elsewhere classified (R26.2);Pain Pain - Right/Left: Left Pain - part of body: Hip     Time: [id number] PT Time Calculation (min) (ACUTE ONLY): 33 min  Charges:                        Ron AgeeLogan Kaushik Maul, SPT    Tiffine Henigan 06/13/2018, 11:00 AM

## 2018-06-13 NOTE — Anesthesia Postprocedure Evaluation (Signed)
Anesthesia Post Note  Patient: Breanna PandyNancy B Bodine  Procedure(s) Performed: TOTAL HIP ARTHROPLASTY ANTERIOR APPROACH (Left )  Patient location during evaluation: Nursing Unit Anesthesia Type: Spinal Level of consciousness: awake and alert and oriented Pain management: pain level controlled Vital Signs Assessment: post-procedure vital signs reviewed and stable Respiratory status: spontaneous breathing and nonlabored ventilation Cardiovascular status: stable Postop Assessment: no headache, no backache, adequate PO intake, no apparent nausea or vomiting and patient able to bend at knees Anesthetic complications: no     Last Vitals:  Vitals:   06/12/18 2340 06/13/18 0434  BP: 125/71 107/64  Pulse: 75 (!) 53  Resp: 16 16  Temp: (!) 36.4 C 36.8 C  SpO2: 97% 99%    Last Pain:  Vitals:   06/13/18 0434  TempSrc: Oral  PainSc:                  Zachary GeorgeWeatherly,  Odel Schmid F

## 2018-06-14 LAB — CBC
HCT: 32.1 % — ABNORMAL LOW (ref 35.0–47.0)
HEMOGLOBIN: 11.2 g/dL — AB (ref 12.0–16.0)
MCH: 31.9 pg (ref 26.0–34.0)
MCHC: 35 g/dL (ref 32.0–36.0)
MCV: 91.1 fL (ref 80.0–100.0)
Platelets: 151 10*3/uL (ref 150–440)
RBC: 3.52 MIL/uL — AB (ref 3.80–5.20)
RDW: 13.1 % (ref 11.5–14.5)
WBC: 9.4 10*3/uL (ref 3.6–11.0)

## 2018-06-14 MED ORDER — ASPIRIN 325 MG PO TBEC: 325.0000 mg | DELAYED_RELEASE_TABLET | Freq: Every day | ORAL | 0 refills | Status: DC

## 2018-06-14 MED ORDER — MAGNESIUM HYDROXIDE 400 MG/5ML PO SUSP
30.0000 mL | Freq: Every day | ORAL | Status: DC | PRN
  Administered 2018-06-14: 30 mL via ORAL
  Filled 2018-06-14: qty 30

## 2018-06-14 MED ORDER — HYDROCODONE-ACETAMINOPHEN 5-325 MG PO TABS: 1.0000 | ORAL_TABLET | ORAL | 0 refills | Status: DC | PRN

## 2018-06-14 MED ORDER — BISACODYL 10 MG RE SUPP
10.0000 mg | Freq: Every day | RECTAL | Status: DC | PRN
  Administered 2018-06-14: 10 mg via RECTAL
  Filled 2018-06-14: qty 1

## 2018-06-14 NOTE — Discharge Instructions (Signed)
ANTERIOR APPROACH TOTAL HIP REPLACEMENT POSTOPERATIVE DIRECTIONS   Hip Rehabilitation, Guidelines Following Surgery  The results of a hip operation are greatly improved after range of motion and muscle strengthening exercises. Follow all safety measures which are given to protect your hip. If any of these exercises cause increased pain or swelling in your joint, decrease the amount until you are comfortable again. Then slowly increase the exercises. Call your caregiver if you have problems or questions.   HOME CARE INSTRUCTIONS  Remove items at home which could result in a fall. This includes throw rugs or furniture in walking pathways.   ICE to the affected hip every three hours for 30 minutes at a time and then as needed for pain and swelling.  Continue to use ice on the hip for pain and swelling from surgery. You may notice swelling that will progress down to the foot and ankle.  This is normal after surgery.  Elevate the leg when you are not up walking on it.    Continue to use the breathing machine which will help keep your temperature down.  It is common for your temperature to cycle up and down following surgery, especially at night when you are not up moving around and exerting yourself.  The breathing machine keeps your lungs expanded and your temperature down.  Do not place pillow under knee, focus on keeping the knee straight while resting  DIET You may resume your previous home diet once your are discharged from the hospital.  DRESSING / WOUND CARE / SHOWERING Keep the Provena woundvac on for 7 days after discharge then can change to a normal dressing. Keep your dressing dry with showering.  You can keep it covered and pat dry. Change the surgical dressing daily and reapply a dry dressing each time.  ACTIVITY Walk with your walker as instructed. Use walker as long as suggested by your caregivers. Avoid periods of inactivity such as sitting longer than an hour when not asleep.  This helps prevent blood clots.  You may resume a sexual relationship in one month or when given the OK by your doctor.  You may return to work once you are cleared by your doctor.  Do not drive a car for 6 weeks or until released by you surgeon.  Do not drive while taking narcotics.  WEIGHT BEARING Weight bearing as tolerated with assist device (walker, cane, etc) as directed, use it as long as suggested by your surgeon or therapist, typically at least 4-6 weeks.  POSTOPERATIVE CONSTIPATION PROTOCOL Constipation - defined medically as fewer than three stools per week and severe constipation as less than one stool per week.  One of the most common issues patients have following surgery is constipation.  Even if you have a regular bowel pattern at home, your normal regimen is likely to be disrupted due to multiple reasons following surgery.  Combination of anesthesia, postoperative narcotics, change in appetite and fluid intake all can affect your bowels.  In order to avoid complications following surgery, here are some recommendations in order to help you during your recovery period.  Colace (docusate) - Pick up an over-the-counter form of Colace or another stool softener and take twice a day as long as you are requiring postoperative pain medications.  Take with a full glass of water daily.  If you experience loose stools or diarrhea, hold the colace until you stool forms back up.  If your symptoms do not get better within 1 week or if they  get worse, check with your doctor.  Dulcolax (bisacodyl) - Pick up over-the-counter and take as directed by the product packaging as needed to assist with the movement of your bowels.  Take with a full glass of water.  Use this product as needed if not relieved by Colace only.   MiraLax (polyethylene glycol) - Pick up over-the-counter to have on hand.  MiraLax is a solution that will increase the amount of water in your bowels to assist with bowel movements.   Take as directed and can mix with a glass of water, juice, soda, coffee, or tea.  Take if you go more than two days without a movement. Do not use MiraLax more than once per day. Call your doctor if you are still constipated or irregular after using this medication for 7 days in a row.  If you continue to have problems with postoperative constipation, please contact the office for further assistance and recommendations.  If you experience "the worst abdominal pain ever" or develop nausea or vomiting, please contact the office immediatly for further recommendations for treatment.  ITCHING  If you experience itching with your medications, try taking only a single pain pill, or even half a pain pill at a time.  You can also use Benadryl over the counter for itching or also to help with sleep.   TED HOSE STOCKINGS Wear the elastic stockings on both legs for three weeks following surgery during the day but you may remove then at night for sleeping.  MEDICATIONS See your medication summary on the After Visit Summary that the nursing staff will review with you prior to discharge.  You may have some home medications which will be placed on hold until you complete the course of blood thinner medication.  It is important for you to complete the blood thinner medication as prescribed by your surgeon.  Continue your approved medications as instructed at time of discharge.  PRECAUTIONS If you experience chest pain or shortness of breath - call 911 immediately for transfer to the hospital emergency department.  If you develop a fever greater that 101 F, purulent drainage from wound, increased redness or drainage from wound, foul odor from the wound/dressing, or calf pain - CONTACT YOUR SURGEON.                                                   FOLLOW-UP APPOINTMENTS Make sure you keep all of your appointments after your operation with your surgeon and caregivers. You should call the office at the above phone  number and make an appointment for approximately two weeks after the date of your surgery or on the date instructed by your surgeon outlined in the "After Visit Summary".  RANGE OF MOTION AND STRENGTHENING EXERCISES  These exercises are designed to help you keep full movement of your hip joint. Follow your caregiver's or physical therapist's instructions. Perform all exercises about fifteen times, three times per day or as directed. Exercise both hips, even if you have had only one joint replacement. These exercises can be done on a training (exercise) mat, on the floor, on a table or on a bed. Use whatever works the best and is most comfortable for you. Use music or television while you are exercising so that the exercises are a pleasant break in your day. This will make your life  better with the exercises acting as a break in routine you can look forward to.  Lying on your back, slowly slide your foot toward your buttocks, raising your knee up off the floor. Then slowly slide your foot back down until your leg is straight again.  Lying on your back spread your legs as far apart as you can without causing discomfort.  Lying on your side, raise your upper leg and foot straight up from the floor as far as is comfortable. Slowly lower the leg and repeat.  Lying on your back, tighten up the muscle in the front of your thigh (quadriceps muscles). You can do this by keeping your leg straight and trying to raise your heel off the floor. This helps strengthen the largest muscle supporting your knee.  Lying on your back, tighten up the muscles of your buttocks both with the legs straight and with the knee bent at a comfortable angle while keeping your heel on the floor.   IF YOU ARE TRANSFERRED TO A SKILLED REHAB FACILITY If the patient is transferred to a skilled rehab facility following release from the hospital, a list of the current medications will be sent to the facility for the patient to continue.  When  discharged from the skilled rehab facility, please have the facility set up the patient's Home Health Physical Therapy prior to being released. Also, the skilled facility will be responsible for providing the patient with their medications at time of release from the facility to include their pain medication, the muscle relaxants, and their blood thinner medication. If the patient is still at the rehab facility at time of the two week follow up appointment, the skilled rehab facility will also need to assist the patient in arranging follow up appointment in our office and any transportation needs.  MAKE SURE YOU:  Understand these instructions.  Get help right away if you are not doing well or get worse.    Pick up stool softner and laxative for home use following surgery while on pain medications. Do not submerge incision under water. Please use good hand washing techniques while changing dressing each day. May shower starting three days after surgery. Please use a clean towel to pat the incision dry following showers. Continue to use ice for pain and swelling after surgery. Do not use any lotions or creams on the incision until instructed by your surgeon.

## 2018-06-14 NOTE — Care Management Note (Signed)
Case Management Note  Patient Details  Name: ALBENA COMES MRN: [id number] Date of Birth: 07-29-1951  Subjective/Objective:   Met with patient at bedside to discuss discharge planning. Patient lives at home with her spouse. She has a walker and bsc. She will discharge home on ASA. Offered a list of home health agencies. Referral to [facility] for HHPT.                  Action/Plan:   Expected Discharge Date:  06/14/18               Expected Discharge Plan:  [place]  In-House Referral:     Discharge planning Services  CM Consult  Post Acute Care Choice:  Home Health Choice offered to:  Patient  DME Arranged:    DME Agency:     HH Arranged:  PT [place]:  [facility] (formerly [facility])  Status of Service:  Completed, signed off  If discussed at [employer], dates discussed:    Additional Comments:  Jolly Mango, RN 06/14/2018, 4:13 PM

## 2018-06-14 NOTE — Discharge Summary (Signed)
Physician Discharge Summary  Patient ID: Breanna Hanson MRN: [id number] DOB/AGE: 1950-12-07 67 y.o.  Admit date: 06/12/2018 Discharge date: 06/14/2018  Admission Diagnoses:  PRIMARY OSTEOARTHRITIS OF LEFT HIP  Discharge Diagnoses: Patient Active Problem List   Diagnosis Date Noted  . Status post total hip replacement, left 06/12/2018    Past Medical History:  Diagnosis Date  . Arthritis   . Elevated lipids   . GERD (gastroesophageal reflux disease)   . Hypertension   . Hypothyroidism   . PONV (postoperative nausea and vomiting)   . Pulmonary nodule      Transfusion: None.   Consultants (if any):   Discharged Condition: Improved  Hospital Course: Breanna Hanson is an 67 y.o. female who was admitted 06/12/2018 with a diagnosis of primary osteoarthritis of the left hip and went to the operating room on 06/12/2018 and underwent the above named procedures.    Surgeries: Procedure(s): TOTAL HIP ARTHROPLASTY ANTERIOR APPROACH on 06/12/2018 Patient tolerated the surgery well. Taken to PACU where she was stabilized and then transferred to the orthopedic floor.  Started on Aspirin 325mg  daily. Foot pumps applied bilaterally at 80 mm. Heels elevated on bed with rolled towels. No evidence of DVT. Negative Homan. Physical therapy started on day #1 for gait training and transfer. OT started day #1 for ADL and assisted devices.  Patient's IV and Foley were removed on POD1.  Hemovac removed on POD2.  Implants: Medacta AMIS 1 standard stem with 50 mm Mpact DM cup and liner, any 8 mm S ceramic head.  She was given perioperative antibiotics:  Anti-infectives (From admission, onward)   Start     Dose/Rate Route Frequency Ordered Stop   06/12/18 1400  ceFAZolin (ANCEF) IVPB 2g/100 mL premix     2 g 200 mL/hr over 30 Minutes Intravenous Every 6 hours 06/12/18 1104 06/13/18 0326   06/12/18 0550  ceFAZolin (ANCEF) 2-4 GM/100ML-% IVPB    Note to Pharmacy:  Agnes Lawrenceobinson, Patricia  : cabinet override       06/12/18 0550 06/12/18 0727   06/11/18 2230  ceFAZolin (ANCEF) IVPB 2g/100 mL premix     2 g 200 mL/hr over 30 Minutes Intravenous  Once 06/11/18 2215 06/12/18 0757    .  She was given sequential compression devices, early ambulation, and Aspirin for DVT prophylaxis.  She benefited maximally from the hospital stay and there were no complications.    Recent vital signs:  Vitals:   06/14/18 0003 06/14/18 0730  BP: 115/69 (!) 105/57  Pulse: 66 68  Resp: 17   Temp: (!) 97.4 F (36.3 C) 97.6 F (36.4 C)  SpO2: 96% 94%    Recent laboratory studies:  Lab Results  Component Value Date   HGB 11.2 (L) 06/14/2018   HGB 11.4 (L) 06/13/2018   HGB 14.2 05/30/2018   Lab Results  Component Value Date   WBC 9.4 06/14/2018   PLT 151 06/14/2018   Lab Results  Component Value Date   INR 0.94 05/30/2018   Lab Results  Component Value Date   NA 138 06/13/2018   K 4.6 06/13/2018   CL 106 06/13/2018   CO2 27 06/13/2018   BUN 11 06/13/2018   CREATININE 0.58 06/13/2018   GLUCOSE 108 (H) 06/13/2018    Discharge Medications:   Allergies as of 06/14/2018   No Known Allergies     Medication List    STOP taking these medications   HYDROcodone-acetaminophen 7.5-325 MG tablet Commonly known as:  NORCO Replaced by:  HYDROcodone-acetaminophen 5-325 MG tablet     TAKE these medications   acetaminophen 325 MG tablet Commonly known as:  TYLENOL Take 650 mg by mouth at bedtime as needed for moderate pain or headache.   aspirin 325 MG EC tablet Take 1 tablet (325 mg total) by mouth daily with breakfast. Start taking on:  06/15/2018   cyanocobalamin 1000 MCG tablet Take 1,000 mcg by mouth daily.   hydrochlorothiazide 25 MG tablet Commonly known as:  HYDRODIURIL Take 25 mg by mouth daily.   HYDROcodone-acetaminophen 5-325 MG tablet Commonly known as:  NORCO/VICODIN Take 1-2 tablets by mouth every 4 (four) hours as needed for moderate pain (pain score 4-6). Replaces:   HYDROcodone-acetaminophen 7.5-325 MG tablet   ibuprofen 200 MG tablet Commonly known as:  ADVIL,MOTRIN Take 400 mg by mouth daily as needed for moderate pain.   levothyroxine 125 MCG tablet Commonly known as:  SYNTHROID, LEVOTHROID Take 125 mcg by mouth daily before breakfast.   lisinopril 10 MG tablet Commonly known as:  PRINIVIL,ZESTRIL Take 10 mg by mouth daily.   loratadine 10 MG tablet Commonly known as:  CLARITIN Take 10 mg by mouth daily as needed for allergies.   MAGNESIUM PO Take 1 tablet by mouth daily as needed (cramps).   meloxicam 7.5 MG tablet Commonly known as:  MOBIC Take 7.5 mg by mouth daily as needed for pain.   naproxen sodium 220 MG tablet Commonly known as:  ALEVE Take 220 mg by mouth daily as needed (pain).   ranitidine 150 MG tablet Commonly known as:  ZANTAC Take 150 mg by mouth daily as needed for heartburn.   simvastatin 40 MG tablet Commonly known as:  ZOCOR Take 40 mg by mouth every evening.   VITAMIN D PO Take 1 tablet by mouth daily.            Durable Medical Equipment  (From admission, onward)         Start     Ordered   06/12/18 1105  DME Bedside commode  Once    Question:  Patient needs a bedside commode to treat with the following condition  Answer:  Status post total hip replacement, left   06/12/18 1104   06/12/18 1105  DME 3 n 1  Once     06/12/18 1104   06/12/18 1105  DME Walker rolling  Once    Question:  Patient needs a walker to treat with the following condition  Answer:  Status post total hip replacement, left   06/12/18 1104          Diagnostic Studies: Dg Hip Operative Unilat W Or W/o Pelvis Left  Result Date: 06/12/2018 CLINICAL DATA:  Left hip replacement EXAM: OPERATIVE LEFT HIP WITH PELVIS COMPARISON:  None. FLUOROSCOPY TIME:  Radiation Exposure Index (as provided by the fluoroscopic device): 3 mGy If the device does not provide the exposure index: Fluoroscopy Time:  6 seconds Number of Acquired  Images:  3 FINDINGS: Initial images demonstrate degenerative changes of the left hip joint. Subsequent images show left hip replacement in satisfactory position. IMPRESSION: Left hip replacement Electronically Signed   By: Alcide Clever M.D.   On: 06/12/2018 08:44   Dg Hip Unilat W Or W/o Pelvis 2-3 Views Left  Result Date: 06/12/2018 CLINICAL DATA:  Postop left hip replacement EXAM: DG HIP (WITH OR WITHOUT PELVIS) 2-3V LEFT COMPARISON:  None. FINDINGS: Changes of left hip replacement. No hardware or bony complicating feature. Normal AP alignment. Mild degenerative changes in  the right hip. IMPRESSION: Left hip replacement.  No visible complicating feature. Electronically Signed   By: Charlett Nose M.D.   On: 06/12/2018 09:33   Disposition: Plan will be for discharge home today pending BM and PT.  Follow-up Information    Anson [place], PA-C Follow up in 14 day(s).   Specialty:  Physician Assistant Why:  Mindi Slicker information: [address] [place] [address] [phone]          Signed: Meriel Pica PA-C 06/14/2018, 7:40 AM

## 2018-06-14 NOTE — Progress Notes (Addendum)
  Subjective: 2 Days Post-Op Procedure(s) (LRB): TOTAL HIP ARTHROPLASTY ANTERIOR APPROACH (Left) Patient reports pain as mild.  No pain while in bed. Patient is well, and has had no acute complaints or problems Plan is for discharge home with HHPT. Negative for chest pain and shortness of breath Fever: no Gastrointestinal:Negative for nausea and vomiting  Objective: Vital signs in last 24 hours: Temp:  [97.4 F (36.3 C)-98.2 F (36.8 C)] 97.6 F (36.4 C) (08/22 0730) Pulse Rate:  [56-72] 68 (08/22 0730) Resp:  [17] 17 (08/22 0003) BP: (105-169)/(47-76) 105/57 (08/22 0730) SpO2:  [94 %-100 %] 94 % (08/22 0730)  Intake/Output from previous day:  Intake/Output Summary (Last 24 hours) at 06/14/2018 0736 Last data filed at 06/14/2018 0448 Gross per 24 hour  Intake 1842.78 ml  Output 160 ml  Net 1682.78 ml    Intake/Output this shift: No intake/output data recorded.  Labs: Recent Labs    06/13/18 0455 06/14/18 0424  HGB 11.4* 11.2*   Recent Labs    06/13/18 0455 06/14/18 0424  WBC 10.6 9.4  RBC 3.52* 3.52*  HCT 32.1* 32.1*  PLT 154 151   Recent Labs    06/13/18 0455  NA 138  K 4.6  CL 106  CO2 27  BUN 11  CREATININE 0.58  GLUCOSE 108*  CALCIUM 8.6*   No results for input(s): LABPT, INR in the last 72 hours.   EXAM General - Patient is Alert, Appropriate and Oriented Extremity - ABD soft Sensation intact distally Intact pulses distally Dorsiflexion/Plantar flexion intact Incision: dressing C/D/I No cellulitis present Dressing/Incision - Woundvac intact to the left hip with minimal drainage.  Hemovac removed this AM. Motor Function - intact, moving foot and toes well on exam.  Abdomen soft with normal BS.  Past Medical History:  Diagnosis Date  . Arthritis   . Elevated lipids   . GERD (gastroesophageal reflux disease)   . Hypertension   . Hypothyroidism   . PONV (postoperative nausea and vomiting)   . Pulmonary nodule      Assessment/Plan: 2 Days Post-Op Procedure(s) (LRB): TOTAL HIP ARTHROPLASTY ANTERIOR APPROACH (Left) Active Problems:   Status post total hip replacement, left  Estimated body mass index is 28.31 kg/m as calculated from the following:   Height as of this encounter: 5\' 4"  (1.626 m).   Weight as of this encounter: 74.8 kg. Up with therapy  Labs reviewed this AM.  Hg 11.2 Up with therapy today.  Will need to clear stairs today. Begin working on BM.  Will move to suppository today. Hemovac removed today. Possible discharge home with HHPT pending BM and PT. Will discharge with Provena woundvac to the left leg.  DVT Prophylaxis - Aspirin, Foot Pumps and TED hose Weight-Bearing as tolerated to left leg  J. Horris LatinoLance Alyxandra Tenbrink, PA-C [facility] 06/14/2018, 7:36 AM

## 2018-06-14 NOTE — Progress Notes (Signed)
Pt discharged to home via wheelchair accompanied by family without incident per MD order. Prior to discharge, discharge teachings done both written and verbal. Pt and family verbalize understanding and agree to comply. Prior to discharge, pt had BM. No change in pt from AM assessment. Pt pain well controlled on discharge. Pt aware of follow up with Amador Cunashomas Gaines PA for suture removal. Pt discharged with prescriptions for Aspirin and Vicodin. Prior to discharge, wound vac changed over to portable vacuum.

## 2018-06-14 NOTE — Progress Notes (Signed)
Physical Therapy Treatment Patient Details Name: Breanna Pandyancy B Belter MRN: [id number] DOB: [birth year] Today's Date: 06/14/2018    History of Present Illness Pt presents to hospital on 06/12/18 for an elective L THR performed by Dr. Rosita KeaMenz. Pt has a past medical history that includes GERD, HLD, HTN, hypothyroidism, and a R knee subchondroplasty with partial medial menisectomy.    PT Comments    Pt seen this morning and states that she is doing well. Pt states that her pain at rest is 0/10 however states that pain increases to 6/10 with activity. Pt instructed in L LE there-ex, packet given and reviewed with pt. Encouraged pt to perform there-ex 2-3 times per day until discontinued by HHPT. Pt amb 250' total with one seated rest break (for education on stair training) using RW with close supervision. Pt instructed in through verbal cuing and demonstration stair training. Pt ascends and descends one stair without rails using RW with CGA assist safely demonstrating safety entering and exiting home environment. PT believes that pt is safe to d/c at this time, RN aware. Pt could benefit from continued skilled therapy at this time to improve deficits toward PLOF. PT will continue to work with pt BID while admitted. D/c recommendations continue to be home with HHPT.   Follow Up Recommendations  Home health PT     Equipment Recommendations  3in1 (PT)    Recommendations for Other Services       Precautions / Restrictions Precautions Precautions: Anterior Hip Precaution Booklet Issued: Yes (comment) Precaution Comments: given and reviewed precaution and ther-ex packet Restrictions Weight Bearing Restrictions: Yes LLE Weight Bearing: Weight bearing as tolerated    Mobility  Bed Mobility               General bed mobility comments: Not assessed this visit. Pt sitting EOB upon arrival and prefers to remain up in chair upon completion  Transfers Overall transfer level: Modified  independent Equipment used: Rolling walker (2 wheeled) Transfers: Sit to/from Stand Sit to Stand: Modified independent (Device/Increase time)         General transfer comment: Pt transfers sit<>stand at RW with modified independence requiring increased UE support and time  Ambulation/Gait Ambulation/Gait assistance: Supervision Gait Distance (Feet): 250 Feet Assistive device: Rolling walker (2 wheeled) Gait Pattern/deviations: Decreased stance time - left;Decreased step length - right;Step-through pattern     General Gait Details: Pt amb 250' this morning with close supervision and improving gait pattern. Pt demonstrates step through gait pattern however still demonstrates decreased stance time on L LE.    Stairs Stairs: Yes Stairs assistance: Min guard Stair Management: With walker;Forwards;No rails Number of Stairs: 1 General stair comments: After verbal cuing and demonstration pt ascends and descends one stair with RW and CGA safely demonstrating safety entering and exit   Wheelchair Mobility    Modified Rankin (Stroke Patients Only)       Balance                                            Cognition Arousal/Alertness: Awake/alert Behavior During Therapy: WFL for tasks assessed/performed Overall Cognitive Status: Within Functional Limits for tasks assessed                                        Exercises  Other Exercises Other Exercises: Pt instructed in and tolerates well L LE ther-ex x12 reps including ankle pumps, quad sets, glut sets, SLR, abd, LAQ, seated marching, and hip adduction isometrics x12 reps each. Reviewed ther-ex packet with patient    General Comments        Pertinent Vitals/Pain Pain Assessment: 0-10 Pain Score: 6 (0/10 at rest, 6/10 with activity) Pain Location: L hip with movement, 0/10 at rest Pain Descriptors / Indicators: Aching;Throbbing Pain Intervention(s): Limited activity within patient's  tolerance;Monitored during session    Home Living                      Prior Function            PT Goals (current goals can now be found in the care plan section) Acute Rehab PT Goals Patient Stated Goal: to get moving and return to PLOF PT Goal Formulation: With patient Time For Goal Achievement: 06/26/18 Potential to Achieve Goals: Good Progress towards PT goals: Progressing toward goals    Frequency    BID      PT Plan Current plan remains appropriate    Co-evaluation              AM-PAC PT "6 Clicks" Daily Activity  Outcome Measure  Difficulty turning over in bed (including adjusting bedclothes, sheets and blankets)?: A Little Difficulty moving from lying on back to sitting on the side of the bed? : A Little Difficulty sitting down on and standing up from a chair with arms (e.g., wheelchair, bedside commode, etc,.)?: A Little Help needed moving to and from a bed to chair (including a wheelchair)?: A Little Help needed walking in hospital room?: A Little Help needed climbing 3-5 steps with a railing? : A Little 6 Click Score: 18    End of Session Equipment Utilized During Treatment: Gait belt Activity Tolerance: Patient tolerated treatment well Patient left: in chair;with call bell/phone within reach Nurse Communication: Mobility status;Other (comment)(d/c readiness) PT Visit Diagnosis: Unsteadiness on feet (R26.81);Other abnormalities of gait and mobility (R26.89);Muscle weakness (generalized) (M62.81);History of falling (Z91.81);Difficulty in walking, not elsewhere classified (R26.2);Pain Pain - Right/Left: Left Pain - part of body: Hip     Time: 0852-0922 PT Time Calculation (min) (ACUTE ONLY): 30 min  Charges:                        Ron Agee, SPT    Chalmer Zheng 06/14/2018, 10:40 AM

## 2018-06-15 LAB — SURGICAL PATHOLOGY

## 2018-06-16 ENCOUNTER — Encounter: Payer: Self-pay | Admitting: Orthopedic Surgery

## 2018-09-18 ENCOUNTER — Other Ambulatory Visit: Payer: Self-pay | Admitting: Internal Medicine

## 2018-09-18 DIAGNOSIS — Z1231 Encounter for screening mammogram for malignant neoplasm of breast: Secondary | ICD-10-CM

## 2018-10-26 ENCOUNTER — Ambulatory Visit
Admission: RE | Admit: 2018-10-26 | Discharge: 2018-10-26 | Disposition: A | Discharge status: Home / Self Care | Payer: BLUE CROSS/BLUE SHIELD | Source: Ambulatory Visit | Attending: Internal Medicine | Admitting: Internal Medicine

## 2018-10-26 DIAGNOSIS — Z1231 Encounter for screening mammogram for malignant neoplasm of breast: Secondary | ICD-10-CM | POA: Diagnosis not present

## 2019-09-27 ENCOUNTER — Other Ambulatory Visit: Payer: Self-pay | Admitting: Internal Medicine

## 2019-09-27 DIAGNOSIS — Z1231 Encounter for screening mammogram for malignant neoplasm of breast: Secondary | ICD-10-CM

## 2019-10-28 ENCOUNTER — Ambulatory Visit
Admission: RE | Admit: 2019-10-28 | Discharge: 2019-10-28 | Disposition: A | Discharge status: Home / Self Care | Payer: BC Managed Care – PPO | Source: Ambulatory Visit | Attending: Internal Medicine | Admitting: Internal Medicine

## 2019-10-28 DIAGNOSIS — Z1231 Encounter for screening mammogram for malignant neoplasm of breast: Secondary | ICD-10-CM | POA: Insufficient documentation

## 2019-11-14 ENCOUNTER — Ambulatory Visit: Payer: BC Managed Care – PPO | Attending: Internal Medicine

## 2019-11-14 DIAGNOSIS — Z23 Encounter for immunization: Secondary | ICD-10-CM | POA: Insufficient documentation

## 2019-11-18 NOTE — Progress Notes (Signed)
   Covid-19 Vaccination Clinic  Name:  Breanna Hanson    MRN: [id number] DOB: [birth year]  11/14/2019  [name] was observed post Covid-19 immunization for 15 minutes without incidence. She was provided with Vaccine Information Sheet and instruction to access the V-Safe system.   [name] was instructed to call 911 with any severe reactions post vaccine: Marland Kitchen Difficulty breathing  . Swelling of your face and throat  . A fast heartbeat  . A bad rash all over your body  . Dizziness and weakness    Immunizations Administered    Name Date Dose VIS Date Route   Moderna COVID-19 Vaccine 11/14/2019  6:42 PM 0.5 mL 09/24/2019 Intramuscular   Manufacturer: Moderna   Lot: [id number]   NDC: 00123-935-94

## 2019-12-12 ENCOUNTER — Ambulatory Visit: Payer: BC Managed Care – PPO

## 2019-12-17 ENCOUNTER — Ambulatory Visit: Payer: BC Managed Care – PPO | Attending: Internal Medicine

## 2019-12-17 DIAGNOSIS — Z23 Encounter for immunization: Secondary | ICD-10-CM | POA: Insufficient documentation

## 2019-12-17 NOTE — Progress Notes (Signed)
   Covid-19 Vaccination Clinic  Name:  Breanna Hanson    MRN: [id number] DOB: [birth year]  12/17/2019  Ms. Stiff was observed post Covid-19 immunization for 15 minutes without incidence. She was provided with Vaccine Information Sheet and instruction to access the V-Safe system.   [name] was instructed to call 911 with any severe reactions post vaccine: Marland Kitchen Difficulty breathing  . Swelling of your face and throat  . A fast heartbeat  . A bad rash all over your body  . Dizziness and weakness    Immunizations Administered    Name Date Dose VIS Date Route   Moderna COVID-19 Vaccine 12/17/2019  6:16 PM 0.5 mL 09/24/2019 Intramuscular   Manufacturer: Moderna   Lot: [id number]   NDC: 40982-867-51

## 2020-06-25 ENCOUNTER — Ambulatory Visit: Payer: BC Managed Care – PPO | Admitting: Dermatology

## 2020-10-27 ENCOUNTER — Other Ambulatory Visit: Payer: Self-pay | Admitting: Internal Medicine

## 2020-10-27 DIAGNOSIS — Z1231 Encounter for screening mammogram for malignant neoplasm of breast: Secondary | ICD-10-CM

## 2020-11-19 ENCOUNTER — Other Ambulatory Visit: Payer: Self-pay

## 2020-11-19 ENCOUNTER — Ambulatory Visit
Admission: RE | Admit: 2020-11-19 | Discharge: 2020-11-19 | Disposition: A | Discharge status: Home / Self Care | Payer: Medicare HMO | Source: Ambulatory Visit | Attending: Internal Medicine | Admitting: Internal Medicine

## 2020-11-19 DIAGNOSIS — Z1231 Encounter for screening mammogram for malignant neoplasm of breast: Secondary | ICD-10-CM | POA: Insufficient documentation

## 2020-12-23 ENCOUNTER — Other Ambulatory Visit: Payer: Self-pay | Admitting: Orthopedic Surgery

## 2020-12-23 DIAGNOSIS — M1711 Unilateral primary osteoarthritis, right knee: Secondary | ICD-10-CM

## 2021-01-08 ENCOUNTER — Ambulatory Visit
Admission: RE | Admit: 2021-01-08 | Discharge: 2021-01-08 | Disposition: A | Discharge status: Home / Self Care | Payer: Medicare HMO | Source: Ambulatory Visit | Attending: Orthopedic Surgery | Admitting: Orthopedic Surgery

## 2021-01-08 ENCOUNTER — Other Ambulatory Visit: Payer: Self-pay

## 2021-01-08 DIAGNOSIS — M1711 Unilateral primary osteoarthritis, right knee: Secondary | ICD-10-CM | POA: Diagnosis not present

## 2021-02-11 ENCOUNTER — Other Ambulatory Visit: Payer: Self-pay | Admitting: Orthopedic Surgery

## 2021-02-23 ENCOUNTER — Other Ambulatory Visit
Admission: RE | Admit: 2021-02-23 | Discharge: 2021-02-23 | Disposition: A | Discharge status: Home / Self Care | Payer: Medicare HMO | Source: Ambulatory Visit | Attending: Orthopedic Surgery | Admitting: Orthopedic Surgery

## 2021-02-23 ENCOUNTER — Other Ambulatory Visit: Payer: Self-pay

## 2021-02-23 DIAGNOSIS — Z01818 Encounter for other preprocedural examination: Secondary | ICD-10-CM | POA: Insufficient documentation

## 2021-02-23 LAB — CBC WITH DIFFERENTIAL/PLATELET
Abs Immature Granulocytes: 0.03 10*3/uL (ref 0.00–0.07)
Basophils Absolute: 0.1 10*3/uL (ref 0.0–0.1)
Basophils Relative: 1 %
Eosinophils Absolute: 0.1 10*3/uL (ref 0.0–0.5)
Eosinophils Relative: 2 %
HCT: 40 % (ref 36.0–46.0)
Hemoglobin: 13.5 g/dL (ref 12.0–15.0)
Immature Granulocytes: 1 %
Lymphocytes Relative: 29 %
Lymphs Abs: 1.8 10*3/uL (ref 0.7–4.0)
MCH: 30.2 pg (ref 26.0–34.0)
MCHC: 33.8 g/dL (ref 30.0–36.0)
MCV: 89.5 fL (ref 80.0–100.0)
Monocytes Absolute: 0.4 10*3/uL (ref 0.1–1.0)
Monocytes Relative: 7 %
Neutro Abs: 3.6 10*3/uL (ref 1.7–7.7)
Neutrophils Relative %: 60 %
Platelets: 232 10*3/uL (ref 150–400)
RBC: 4.47 MIL/uL (ref 3.87–5.11)
RDW: 12.3 % (ref 11.5–15.5)
WBC: 6 10*3/uL (ref 4.0–10.5)
nRBC: 0 % (ref 0.0–0.2)

## 2021-02-23 LAB — COMPREHENSIVE METABOLIC PANEL
ALT: 22 U/L (ref 0–44)
AST: 29 U/L (ref 15–41)
Albumin: 4.5 g/dL (ref 3.5–5.0)
Alkaline Phosphatase: 60 U/L (ref 38–126)
Anion gap: 9 (ref 5–15)
BUN: 28 mg/dL — ABNORMAL HIGH (ref 8–23)
CO2: 24 mmol/L (ref 22–32)
Calcium: 9.9 mg/dL (ref 8.9–10.3)
Chloride: 103 mmol/L (ref 98–111)
Creatinine, Ser: 1.32 mg/dL — ABNORMAL HIGH (ref 0.44–1.00)
GFR, Estimated: 44 mL/min — ABNORMAL LOW (ref >60)
Glucose, Bld: 95 mg/dL (ref 70–99)
Potassium: 4.3 mmol/L (ref 3.5–5.1)
Sodium: 136 mmol/L (ref 135–145)
Total Bilirubin: 0.6 mg/dL (ref 0.3–1.2)
Total Protein: 8.1 g/dL (ref 6.5–8.1)

## 2021-02-23 LAB — URINALYSIS, ROUTINE W REFLEX MICROSCOPIC
Bilirubin Urine: NEGATIVE
Glucose, UA: NEGATIVE mg/dL
Hgb urine dipstick: NEGATIVE
Ketones, ur: NEGATIVE mg/dL
Leukocytes,Ua: NEGATIVE
Nitrite: NEGATIVE
Protein, ur: NEGATIVE mg/dL
Specific Gravity, Urine: 1.005 (ref 1.005–1.030)
pH: 5 (ref 5.0–8.0)

## 2021-02-23 LAB — SURGICAL PCR SCREEN
MRSA, PCR: NEGATIVE
Staphylococcus aureus: NEGATIVE

## 2021-02-23 LAB — TYPE AND SCREEN
ABO/RH(D): A POS
Antibody Screen: NEGATIVE

## 2021-02-23 NOTE — Patient Instructions (Signed)
Your procedure is scheduled on: Thursday Mar 04, 2021. Report to Day Surgery inside [facility] 2nd floor (stop by admissions desk first before getting on elevator). To find out your arrival time please call [phone] between 1PM - 3PM on Wednesday Mar 03, 2021.  Remember: Instructions that are not followed completely may result in serious medical risk,  up to and including death, or upon the discretion of your surgeon and anesthesiologist your  surgery may need to be rescheduled.     _X__ 1. Do not eat food after midnight the night before your procedure.                 No chewing gum or hard candies. You may drink clear liquids up to 2 hours                 before you are scheduled to arrive for your surgery- DO not drink clear                 liquids within 2 hours of the start of your surgery.                 Clear Liquids include:  water, apple juice without pulp, clear Gatorade, G2 or                  Gatorade Zero (avoid Red/Purple/Blue), Black Coffee or Tea (Do not add                 anything to coffee or tea).  __X__2.   Complete the "Ensure Clear Pre-surgery Clear Carbohydrate Drink" provided to you, 2 hours before arrival. **If you       are diabetic you will be provided with an alternative drink, Gatorade Zero or G2.  __X__3.  On the morning of surgery brush your teeth with toothpaste and water, you                may rinse your mouth with mouthwash if you wish.  Do not swallow any toothpaste of mouthwash.     _X__ 4.  No Alcohol for 24 hours before or after surgery.   _X__ 5.  Do Not Smoke or use e-cigarettes For 24 Hours Prior to Your Surgery.                 Do not use any chewable tobacco products for at least 6 hours prior to                 Surgery.  _X__  6.  Do not use any recreational drugs (marijuana, cocaine, heroin, ecstasy, MDMA or other)                For at least one week prior to your surgery.  Combination of these drugs with  anesthesia                May have life threatening results.   __X__  7.  Notify your doctor if there is any change in your medical condition      (cold, fever, infections).     Do not wear jewelry, make-up, hairpins, clips or nail polish. Do not wear lotions, powders, or perfumes. You may wear deodorant. Do not shave 48 hours prior to surgery. Men may shave face and neck. Do not bring valuables to the hospital.    [facility] is not responsible for any belongings or valuables.  Contacts, dentures or bridgework may not be worn into surgery.  Leave your suitcase in the car. After surgery it may be brought to your room. For patients admitted to the hospital, discharge time is determined by your treatment team.   Patients discharged the day of surgery will not be allowed to drive home.   Make arrangements for someone to be with you for the first 24 hours of your Same Day Discharge.    Please read over the following fact sheets that you were given:   Joint Replacement book    __X__ Take these medicines the morning of surgery with A SIP OF WATER:    1. levothyroxine (SYNTHROID) 112 MCG   2.    3.   4.  5.  6.  ____ Fleet Enema (as directed)   __X__ Use CHG Soap (or wipes) as directed  ____ Use Benzoyl Peroxide Gel as instructed  ____ Use inhalers on the day of surgery  ____ Stop metformin 2 days prior to surgery    ____ Take 1/2 of usual insulin dose the night before surgery. No insulin the morning          of surgery.   ____ Call your PCP, cardiologist, or Pulmonologist if taking Coumadin/Plavix/aspirin and ask when to stop before your surgery.   __X__ One Week prior to surgery- Stop Anti-inflammatories such as Ibuprofen, Aleve, Advil, Motrin, meloxicam (MOBIC), diclofenac, etodolac, ketorolac, Toradol, Daypro, piroxicam, Goody's or BC powders. OK TO USE TYLENOL IF NEEDED   __X__ Stop supplements until after surgery.  Multiple Vitamins-Minerals,MegaRed Omega-3 Krill  Oil 500 MG, cyanocobalamin 1000 MCG, CALCIUM / VITAMIN D3, and Ascorbic Acid (VITAMIN C)  ____ Bring C-Pap to the hospital.    If you have any questions regarding your pre-procedure instructions,  Please call Pre-admit Testing at [phone]

## 2021-03-02 ENCOUNTER — Other Ambulatory Visit: Payer: Self-pay

## 2021-03-02 ENCOUNTER — Other Ambulatory Visit
Admission: RE | Admit: 2021-03-02 | Discharge: 2021-03-02 | Disposition: A | Discharge status: Home / Self Care | Payer: Medicare HMO | Source: Ambulatory Visit | Attending: Orthopedic Surgery | Admitting: Orthopedic Surgery

## 2021-03-02 DIAGNOSIS — U071 COVID-19: Secondary | ICD-10-CM | POA: Insufficient documentation

## 2021-03-02 DIAGNOSIS — Z01812 Encounter for preprocedural laboratory examination: Secondary | ICD-10-CM | POA: Insufficient documentation

## 2021-03-02 LAB — SARS CORONAVIRUS 2 (TAT 6-24 HRS): SARS Coronavirus 2: POSITIVE — AB

## 2021-03-03 ENCOUNTER — Telehealth: Payer: Self-pay | Admitting: Oncology

## 2021-03-03 MED ORDER — MOLNUPIRAVIR EUA 200MG CAPSULE: 4.0000 | ORAL_CAPSULE | Freq: Two times a day (BID) | ORAL | 0 refills | Status: AC

## 2021-03-03 NOTE — Telephone Encounter (Signed)
Outpatient Oral COVID Treatment Note  I connected with Breanna Hanson on 03/03/2021/12:56 PM by telephone and verified that I am speaking with the correct person using two identifiers.  I discussed the limitations, risks, security, and privacy concerns of performing an evaluation and management service by telephone and the availability of in person appointments. I also discussed with the patient that there may be a patient responsible charge related to this service. The patient expressed understanding and agreed to proceed.  Patient location: Home Provider location: Clinic   Diagnosis: COVID-19 infection  Purpose of visit: Discussion of potential use of Molnupiravir or Paxlovid, a new treatment for mild to moderate COVID-19 viral infection in non-hospitalized patients.   Subjective: Patient is a 70 y.o. female who has been diagnosed with COVID 19 viral infection.  Their symptoms began on 03/01/21.   Past Medical History:  Diagnosis Date  . Arthritis   . Elevated lipids   . GERD (gastroesophageal reflux disease)   . Hypertension   . Hypothyroidism   . PONV (postoperative nausea and vomiting)   . Pulmonary nodule     No Known Allergies   Current Outpatient Medications:  .  molnupiravir EUA 200 mg CAPS, Take 4 capsules (800 mg total) by mouth 2 (two) times daily for 5 days., Disp: 40 capsule, Rfl: 0 .  acetaminophen (TYLENOL) 650 MG CR tablet, Take 1,300 mg by mouth every 8 (eight) hours as needed for pain., Disp: , Rfl:  .  amoxicillin (AMOXIL) 500 MG capsule, Take 500 mg by mouth 4 (four) times daily., Disp: , Rfl:  .  Ascorbic Acid (VITAMIN C) 1000 MG tablet, Take 1,000 mg by mouth daily., Disp: , Rfl:  .  Calcium Carb-Cholecalciferol (CALCIUM 600/VITAMIN D3 PO), Take 1 tablet by mouth daily., Disp: , Rfl:  .  cyanocobalamin 1000 MCG tablet, Take 1,000 mcg by mouth daily., Disp: , Rfl:  .  diphenhydrAMINE (BENADRYL) 25 MG tablet, Take 25 mg by mouth daily as needed for allergies., Disp: ,  Rfl:  .  hydrochlorothiazide (HYDRODIURIL) 25 MG tablet, Take 25 mg by mouth daily., Disp: , Rfl:  .  ibuprofen (ADVIL,MOTRIN) 200 MG tablet, Take 200-400 mg by mouth every 8 (eight) hours as needed for moderate pain., Disp: , Rfl:  .  levothyroxine (SYNTHROID) 112 MCG tablet, Take 112 mcg by mouth daily before breakfast., Disp: , Rfl:  .  lisinopril (ZESTRIL) 20 MG tablet, Take 20 mg by mouth daily., Disp: , Rfl:  .  MegaRed Omega-3 Krill Oil 500 MG CAPS, Take 500 mg by mouth daily., Disp: , Rfl:  .  Multiple Vitamins-Minerals (MULTIVITAMIN WITH MINERALS) tablet, Take 1 tablet by mouth daily., Disp: , Rfl:  .  simvastatin (ZOCOR) 40 MG tablet, Take 40 mg by mouth every evening. , Disp: , Rfl:   Objective: Patient sounds well.  They are in no apparent distress.  Breathing is non labored.  Mood and behavior are normal.  Laboratory Data:  Recent Results (from the past 2160 hour(s))  CBC WITH DIFFERENTIAL     Status: None   Collection Time: 02/23/21 12:10 PM  Result Value Ref Range   WBC 6.0 4.0 - 10.5 K/uL   RBC 4.47 3.87 - 5.11 MIL/uL   Hemoglobin 13.5 12.0 - 15.0 g/dL   HCT 04.540.0 40.936.0 - 81.146.0 %   MCV 89.5 80.0 - 100.0 fL   MCH 30.2 26.0 - 34.0 pg   MCHC 33.8 30.0 - 36.0 g/dL   RDW 91.412.3 78.211.5 - 95.615.5 %  Platelets 232 150 - 400 K/uL   nRBC 0.0 0.0 - 0.2 %   Neutrophils Relative % 60 %   Neutro Abs 3.6 1.7 - 7.7 K/uL   Lymphocytes Relative 29 %   Lymphs Abs 1.8 0.7 - 4.0 K/uL   Monocytes Relative 7 %   Monocytes Absolute 0.4 0.1 - 1.0 K/uL   Eosinophils Relative 2 %   Eosinophils Absolute 0.1 0.0 - 0.5 K/uL   Basophils Relative 1 %   Basophils Absolute 0.1 0.0 - 0.1 K/uL   Immature Granulocytes 1 %   Abs Immature Granulocytes 0.03 0.00 - 0.07 K/uL    Comment: Performed at [facility], [address] [place], [place] [address]  Comprehensive metabolic panel     Status: Abnormal   Collection Time: 02/23/21 12:10 PM  Result Value Ref Range   Sodium 136 135 - 145 mmol/L    Potassium 4.3 3.5 - 5.1 mmol/L   Chloride 103 98 - 111 mmol/L   CO2 24 22 - 32 mmol/L   Glucose, Bld 95 70 - 99 mg/dL    Comment: Glucose reference range applies only to samples taken after fasting for at least 8 hours.   BUN 28 (H) 8 - 23 mg/dL   Creatinine, Ser 1.44 (H) 0.44 - 1.00 mg/dL   Calcium 9.9 8.9 - 31.5 mg/dL   Total Protein 8.1 6.5 - 8.1 g/dL   Albumin 4.5 3.5 - 5.0 g/dL   AST 29 15 - 41 U/L   ALT 22 0 - 44 U/L   Alkaline Phosphatase 60 38 - 126 U/L   Total Bilirubin 0.6 0.3 - 1.2 mg/dL   GFR, Estimated 44 (L) >60 mL/min    Comment: (NOTE) Calculated using the CKD-EPI Creatinine Equation (2021)    Anion gap 9 5 - 15    Comment: Performed at [facility], [address] [place], [place] [address]  Urinalysis, Routine w reflex microscopic     Status: Abnormal   Collection Time: 02/23/21 12:10 PM  Result Value Ref Range   Color, Urine STRAW (A) YELLOW   APPearance CLEAR (A) CLEAR   Specific Gravity, Urine 1.005 1.005 - 1.030   pH 5.0 5.0 - 8.0   Glucose, UA NEGATIVE NEGATIVE mg/dL   Hgb urine dipstick NEGATIVE NEGATIVE   Bilirubin Urine NEGATIVE NEGATIVE   Ketones, ur NEGATIVE NEGATIVE mg/dL   Protein, ur NEGATIVE NEGATIVE mg/dL   Nitrite NEGATIVE NEGATIVE   Leukocytes,Ua NEGATIVE NEGATIVE    Comment: Performed at [facility], [address] [place], [place] [address]  Type and screen Order type and screen if day of surgery is less than 15 days from draw of preadmission visit or order morning of surgery if day of surgery is greater than 6 days from preadmission visit.     Status: None   Collection Time: 02/23/21 12:10 PM  Result Value Ref Range   ABO/RH(D) A POS    Antibody Screen NEG    Sample Expiration 03/09/2021,2359    Extend sample reason      NO TRANSFUSIONS OR PREGNANCY IN THE PAST 3 MONTHS Performed at [facility], [address] [place], [place] [address]   Surgical pcr screen     Status: None   Collection Time:  02/23/21 12:10 PM   Specimen: Nasal Mucosa; Nasal Swab  Result Value Ref Range   MRSA, PCR NEGATIVE NEGATIVE   Staphylococcus aureus NEGATIVE NEGATIVE    Comment: (NOTE) The Xpert SA Assay (FDA  approved for NASAL specimens in patients 43 years of age and older), is one component of a comprehensive surveillance program. It is not intended to diagnose infection nor to guide or monitor treatment. Performed at [facility], [address] [place], [place] [address]   SARS CORONAVIRUS 2 (TAT 6-24 HRS) Nasopharyngeal Nasopharyngeal Swab     Status: Abnormal   Collection Time: 03/02/21 10:07 AM   Specimen: Nasopharyngeal Swab  Result Value Ref Range   SARS Coronavirus 2 POSITIVE (A) NEGATIVE    Comment: (NOTE) SARS-CoV-2 target nucleic acids are DETECTED.  The SARS-CoV-2 RNA is generally detectable in upper and lower respiratory specimens during the acute phase of infection. Positive results are indicative of the presence of SARS-CoV-2 RNA. Clinical correlation with patient history and other diagnostic information is  necessary to determine patient infection status. Positive results do not rule out bacterial infection or co-infection with other viruses.  The expected result is Negative.  Fact Sheet for Patients: HairSlick.no  Fact Sheet for Healthcare Providers: quierodirigir.com  This test is not yet approved or cleared by the [place] FDA and  has been authorized for detection and/or diagnosis of SARS-CoV-2 by FDA under an Emergency Use Authorization (EUA). This EUA will remain  in effect (meaning this test can be used) for the duration of the COVID-19 declaration under Section 564(b)(1) of the Act, 21 U. S.C. section 360bbb-3(b)(1), unless the authorization is terminated or revoked sooner.   Performed at [facility], [address], [place], [place] [address]      Assessment: 70 y.o. female with  mild/moderate COVID 19 viral infection diagnosed on 03/01/21 at high risk for progression to severe COVID 19.  Plan:  This patient is a 70 y.o. female that meets the following criteria for Emergency Use Authorization of: Molnupiravir  1. Age >18 yr 2. SARS-COV-2 positive test 3. Symptom onset < 5 days 4. Mild-to-moderate COVID disease with high risk for severe progression to hospitalization or death   I have spoken and communicated the following to the patient or parent/caregiver regarding: 1. Molnupiravir is an unapproved drug that is authorized for use under an TEFL teacher.  2. There are no adequate, approved, available products for the treatment of COVID-19 in adults who have mild-to-moderate COVID-19 and are at high risk for progressing to severe COVID-19, including hospitalization or death. 3. Other therapeutics are currently authorized. For additional information on all products authorized for treatment or prevention of COVID-19, please see https://www.graham-miller.com/.  4. There are benefits and risks of taking this treatment as outlined in the "Fact Sheet for Patients and Caregivers."  5. "Fact Sheet for Patients and Caregivers" was reviewed with patient. A hard copy will be provided to patient from pharmacy prior to the patient receiving treatment. 6. Patients should continue to self-isolate and use infection control measures (e.g., wear mask, isolate, social distance, avoid sharing personal items, clean and disinfect "high touch" surfaces, and frequent handwashing) according to CDC guidelines.  7. The patient or parent/caregiver has the option to accept or refuse treatment. 8. [employer] has established a pregnancy surveillance program. 9. Females of childbearing potential should use a reliable method of contraception correctly and consistently, as applicable, for the  duration of treatment and for 4 days after the last dose of Molnupiravir. 10. Males of reproductive potential who are sexually active with females of childbearing potential should use a reliable method of contraception correctly and consistently during treatment and for at least 3  months after the last dose. 11. Pregnancy status and risk was assessed. Patient verbalized understanding of precautions.   After reviewing above information with the patient, the patient agrees to receive molnupiravir.  Follow up instructions:    . Take prescription BID x 5 days as directed . Reach out to pharmacist for counseling on medication if desired . For concerns regarding further COVID symptoms please follow up with your PCP or urgent care . For urgent or life-threatening issues, seek care at your local emergency department  The patient was provided an opportunity to ask questions, and all were answered. The patient agreed with the plan and demonstrated an understanding of the instructions.   [facility] [address] [place] - [address]  [address] [place], [place] [address]  Phone:  [phone] Fax:  [phone]   The patient was advised to call their PCP or seek an in-person evaluation if the symptoms worsen or if the condition fails to improve as anticipated.   I provided 15 minutes of non face-to-face telephone visit time during this encounter, and > 50% was spent counseling as documented under my assessment & plan.  Mauro Kaufmann, NP 03/03/2021 /12:56 PM

## 2021-03-23 ENCOUNTER — Inpatient Hospital Stay
Admission: RE | Admit: 2021-03-23 | Discharge: 2021-03-25 | DRG: 470 | Disposition: A | Discharge status: Home Health | Payer: Medicare HMO | Attending: Orthopedic Surgery | Admitting: Orthopedic Surgery

## 2021-03-23 ENCOUNTER — Encounter
Admission: RE | Disposition: A | Discharge status: Home Health | Payer: Self-pay | Source: Home / Self Care | Attending: Orthopedic Surgery

## 2021-03-23 ENCOUNTER — Observation Stay: Payer: Medicare HMO

## 2021-03-23 ENCOUNTER — Inpatient Hospital Stay: Payer: Medicare HMO

## 2021-03-23 ENCOUNTER — Encounter: Payer: Self-pay | Admitting: Orthopedic Surgery

## 2021-03-23 ENCOUNTER — Other Ambulatory Visit: Payer: Self-pay

## 2021-03-23 DIAGNOSIS — Z79899 Other long term (current) drug therapy: Secondary | ICD-10-CM

## 2021-03-23 DIAGNOSIS — Z8249 Family history of ischemic heart disease and other diseases of the circulatory system: Secondary | ICD-10-CM

## 2021-03-23 DIAGNOSIS — R911 Solitary pulmonary nodule: Secondary | ICD-10-CM | POA: Diagnosis present

## 2021-03-23 DIAGNOSIS — Z9049 Acquired absence of other specified parts of digestive tract: Secondary | ICD-10-CM

## 2021-03-23 DIAGNOSIS — G8918 Other acute postprocedural pain: Secondary | ICD-10-CM

## 2021-03-23 DIAGNOSIS — R110 Nausea: Secondary | ICD-10-CM | POA: Diagnosis not present

## 2021-03-23 DIAGNOSIS — Z96651 Presence of right artificial knee joint: Secondary | ICD-10-CM

## 2021-03-23 DIAGNOSIS — K5790 Diverticulosis of intestine, part unspecified, without perforation or abscess without bleeding: Secondary | ICD-10-CM | POA: Diagnosis present

## 2021-03-23 DIAGNOSIS — Z79890 Hormone replacement therapy (postmenopausal): Secondary | ICD-10-CM

## 2021-03-23 DIAGNOSIS — Z8371 Family history of colonic polyps: Secondary | ICD-10-CM

## 2021-03-23 DIAGNOSIS — E538 Deficiency of other specified B group vitamins: Secondary | ICD-10-CM | POA: Diagnosis present

## 2021-03-23 DIAGNOSIS — K219 Gastro-esophageal reflux disease without esophagitis: Secondary | ICD-10-CM | POA: Diagnosis present

## 2021-03-23 DIAGNOSIS — E039 Hypothyroidism, unspecified: Secondary | ICD-10-CM | POA: Diagnosis present

## 2021-03-23 DIAGNOSIS — M8589 Other specified disorders of bone density and structure, multiple sites: Secondary | ICD-10-CM | POA: Diagnosis present

## 2021-03-23 DIAGNOSIS — I10 Essential (primary) hypertension: Secondary | ICD-10-CM | POA: Diagnosis present

## 2021-03-23 DIAGNOSIS — Z833 Family history of diabetes mellitus: Secondary | ICD-10-CM

## 2021-03-23 DIAGNOSIS — M1711 Unilateral primary osteoarthritis, right knee: Principal | ICD-10-CM | POA: Diagnosis present

## 2021-03-23 DIAGNOSIS — Z91018 Allergy to other foods: Secondary | ICD-10-CM

## 2021-03-23 DIAGNOSIS — E785 Hyperlipidemia, unspecified: Secondary | ICD-10-CM | POA: Diagnosis present

## 2021-03-23 DIAGNOSIS — R0781 Pleurodynia: Secondary | ICD-10-CM

## 2021-03-23 HISTORY — PX: TOTAL KNEE ARTHROPLASTY: SHX125

## 2021-03-23 LAB — TYPE AND SCREEN
ABO/RH(D): A POS
Antibody Screen: NEGATIVE

## 2021-03-23 LAB — CREATININE, SERUM
Creatinine, Ser: 1.23 mg/dL — ABNORMAL HIGH (ref 0.44–1.00)
GFR, Estimated: 48 mL/min — ABNORMAL LOW (ref >60)

## 2021-03-23 LAB — CBC
HCT: 35.3 % — ABNORMAL LOW (ref 36.0–46.0)
Hemoglobin: 12.1 g/dL (ref 12.0–15.0)
MCH: 30.3 pg (ref 26.0–34.0)
MCHC: 34.3 g/dL (ref 30.0–36.0)
MCV: 88.5 fL (ref 80.0–100.0)
Platelets: 182 10*3/uL (ref 150–400)
RBC: 3.99 MIL/uL (ref 3.87–5.11)
RDW: 12.5 % (ref 11.5–15.5)
WBC: 8.9 10*3/uL (ref 4.0–10.5)
nRBC: 0 % (ref 0.0–0.2)

## 2021-03-23 SURGERY — ARTHROPLASTY, KNEE, TOTAL
Anesthesia: Spinal | Site: Knee | Laterality: Right

## 2021-03-23 MED ORDER — MIDAZOLAM HCL 5 MG/5ML IJ SOLN
INTRAMUSCULAR | Status: DC | PRN
  Administered 2021-03-23 (×2): 1 mg via INTRAVENOUS

## 2021-03-23 MED ORDER — ONDANSETRON HCL 4 MG/2ML IJ SOLN
4.0000 mg | Freq: Four times a day (QID) | INTRAMUSCULAR | Status: DC | PRN
  Administered 2021-03-23: 4 mg via INTRAVENOUS
  Filled 2021-03-23: qty 2

## 2021-03-23 MED ORDER — ZOLPIDEM TARTRATE 5 MG PO TABS: 5.0000 mg | ORAL_TABLET | Freq: Every evening | ORAL | Status: DC | PRN

## 2021-03-23 MED ORDER — PHENYLEPHRINE HCL (PRESSORS) 10 MG/ML IV SOLN
INTRAVENOUS | Status: AC
  Filled 2021-03-23: qty 1

## 2021-03-23 MED ORDER — BUPIVACAINE HCL (PF) 0.5 % IJ SOLN
INTRAMUSCULAR | Status: DC | PRN
  Administered 2021-03-23: 3 mL

## 2021-03-23 MED ORDER — PROPOFOL 500 MG/50ML IV EMUL
INTRAVENOUS | Status: DC | PRN
  Administered 2021-03-23: 100 ug/kg/min via INTRAVENOUS

## 2021-03-23 MED ORDER — SIMVASTATIN 40 MG PO TABS
40.0000 mg | ORAL_TABLET | Freq: Every evening | ORAL | Status: DC
  Administered 2021-03-23 – 2021-03-24 (×2): 40 mg via ORAL
  Filled 2021-03-23: qty 2
  Filled 2021-03-23 (×3): qty 1

## 2021-03-23 MED ORDER — POLYETHYLENE GLYCOL 3350 17 G PO PACK
17.0000 g | PACK | Freq: Every day | ORAL | Status: DC | PRN
  Administered 2021-03-25: 17 g via ORAL
  Filled 2021-03-23 (×2): qty 1

## 2021-03-23 MED ORDER — DOCUSATE SODIUM 100 MG PO CAPS
100.0000 mg | ORAL_CAPSULE | Freq: Two times a day (BID) | ORAL | Status: DC
  Administered 2021-03-23 – 2021-03-25 (×5): 100 mg via ORAL
  Filled 2021-03-23 (×5): qty 1

## 2021-03-23 MED ORDER — MENTHOL 3 MG MT LOZG
1.0000 | LOZENGE | OROMUCOSAL | Status: DC | PRN
  Filled 2021-03-23: qty 9

## 2021-03-23 MED ORDER — SODIUM CHLORIDE 0.9 % IV SOLN: INTRAVENOUS | Status: DC

## 2021-03-23 MED ORDER — ORAL CARE MOUTH RINSE: 15.0000 mL | Freq: Once | OROMUCOSAL | Status: AC

## 2021-03-23 MED ORDER — NEOMYCIN-POLYMYXIN B GU 40-200000 IR SOLN
Status: DC | PRN
  Administered 2021-03-23: 2 mL

## 2021-03-23 MED ORDER — LIDOCAINE HCL (PF) 2 % IJ SOLN
INTRAMUSCULAR | Status: AC
  Filled 2021-03-23: qty 5

## 2021-03-23 MED ORDER — FAMOTIDINE 20 MG PO TABS
ORAL_TABLET | ORAL | Status: AC
  Administered 2021-03-23: 20 mg via ORAL
  Filled 2021-03-23: qty 1

## 2021-03-23 MED ORDER — PROPOFOL 10 MG/ML IV BOLUS
INTRAVENOUS | Status: DC | PRN
  Administered 2021-03-23 (×2): 20 mg via INTRAVENOUS

## 2021-03-23 MED ORDER — PROPOFOL 1000 MG/100ML IV EMUL
INTRAVENOUS | Status: AC
  Filled 2021-03-23: qty 100

## 2021-03-23 MED ORDER — HYDROCHLOROTHIAZIDE 25 MG PO TABS
25.0000 mg | ORAL_TABLET | Freq: Every day | ORAL | Status: DC
  Administered 2021-03-23 – 2021-03-25 (×3): 25 mg via ORAL
  Filled 2021-03-23 (×3): qty 1

## 2021-03-23 MED ORDER — PHENOL 1.4 % MT LIQD
1.0000 | OROMUCOSAL | Status: DC | PRN
  Filled 2021-03-23: qty 177

## 2021-03-23 MED ORDER — ONDANSETRON HCL 4 MG/2ML IJ SOLN
INTRAMUSCULAR | Status: DC | PRN
  Administered 2021-03-23: 4 mg via INTRAVENOUS

## 2021-03-23 MED ORDER — CEFAZOLIN SODIUM-DEXTROSE 2-4 GM/100ML-% IV SOLN
INTRAVENOUS | Status: AC
  Filled 2021-03-23: qty 100

## 2021-03-23 MED ORDER — LEVOTHYROXINE SODIUM 112 MCG PO TABS
112.0000 ug | ORAL_TABLET | Freq: Every day | ORAL | Status: DC
  Administered 2021-03-24 – 2021-03-25 (×2): 112 ug via ORAL
  Filled 2021-03-23 (×3): qty 1

## 2021-03-23 MED ORDER — EPHEDRINE 5 MG/ML INJ
INTRAVENOUS | Status: AC
  Filled 2021-03-23: qty 10

## 2021-03-23 MED ORDER — ONDANSETRON HCL 4 MG PO TABS
4.0000 mg | ORAL_TABLET | Freq: Four times a day (QID) | ORAL | Status: DC | PRN
  Administered 2021-03-24 – 2021-03-25 (×2): 4 mg via ORAL
  Filled 2021-03-23 (×2): qty 1

## 2021-03-23 MED ORDER — CEFAZOLIN SODIUM-DEXTROSE 2-4 GM/100ML-% IV SOLN
2.0000 g | INTRAVENOUS | Status: AC
  Administered 2021-03-23: 2 g via INTRAVENOUS

## 2021-03-23 MED ORDER — ONDANSETRON HCL 4 MG/2ML IJ SOLN
INTRAMUSCULAR | Status: AC
  Filled 2021-03-23: qty 2

## 2021-03-23 MED ORDER — DEXAMETHASONE SODIUM PHOSPHATE 10 MG/ML IJ SOLN
INTRAMUSCULAR | Status: AC
  Filled 2021-03-23: qty 1

## 2021-03-23 MED ORDER — PANTOPRAZOLE SODIUM 40 MG PO TBEC
40.0000 mg | DELAYED_RELEASE_TABLET | Freq: Every day | ORAL | Status: DC
  Administered 2021-03-23 – 2021-03-25 (×3): 40 mg via ORAL
  Filled 2021-03-23 (×3): qty 1

## 2021-03-23 MED ORDER — METOCLOPRAMIDE HCL 5 MG/ML IJ SOLN
5.0000 mg | Freq: Three times a day (TID) | INTRAMUSCULAR | Status: DC | PRN
  Administered 2021-03-24: 10 mg via INTRAVENOUS
  Filled 2021-03-23: qty 2

## 2021-03-23 MED ORDER — ONDANSETRON HCL 4 MG/2ML IJ SOLN: 4.0000 mg | Freq: Once | INTRAMUSCULAR | Status: DC | PRN

## 2021-03-23 MED ORDER — FENTANYL CITRATE (PF) 100 MCG/2ML IJ SOLN: 25.0000 ug | INTRAMUSCULAR | Status: DC | PRN

## 2021-03-23 MED ORDER — CEFAZOLIN SODIUM-DEXTROSE 2-4 GM/100ML-% IV SOLN
2.0000 g | Freq: Four times a day (QID) | INTRAVENOUS | Status: AC
  Administered 2021-03-23 (×2): 2 g via INTRAVENOUS
  Filled 2021-03-23 (×2): qty 100

## 2021-03-23 MED ORDER — SODIUM CHLORIDE FLUSH 0.9 % IV SOLN
INTRAVENOUS | Status: AC
  Filled 2021-03-23: qty 40

## 2021-03-23 MED ORDER — SODIUM CHLORIDE 0.9 % IV SOLN
INTRAVENOUS | Status: DC | PRN
  Administered 2021-03-23: 60 mL

## 2021-03-23 MED ORDER — MORPHINE SULFATE (PF) 10 MG/ML IV SOLN
INTRAVENOUS | Status: AC
  Filled 2021-03-23: qty 1

## 2021-03-23 MED ORDER — FAMOTIDINE 20 MG PO TABS: 20.0000 mg | ORAL_TABLET | Freq: Once | ORAL | Status: AC

## 2021-03-23 MED ORDER — METHOCARBAMOL 1000 MG/10ML IJ SOLN
500.0000 mg | Freq: Four times a day (QID) | INTRAVENOUS | Status: DC | PRN
  Filled 2021-03-23: qty 5

## 2021-03-23 MED ORDER — BISACODYL 10 MG RE SUPP
10.0000 mg | Freq: Every day | RECTAL | Status: DC | PRN
  Filled 2021-03-23: qty 1

## 2021-03-23 MED ORDER — MIDAZOLAM HCL 2 MG/2ML IJ SOLN
INTRAMUSCULAR | Status: AC
  Filled 2021-03-23: qty 2

## 2021-03-23 MED ORDER — ENOXAPARIN SODIUM 30 MG/0.3ML IJ SOSY
30.0000 mg | PREFILLED_SYRINGE | Freq: Two times a day (BID) | INTRAMUSCULAR | Status: DC
  Administered 2021-03-24 – 2021-03-25 (×3): 30 mg via SUBCUTANEOUS
  Filled 2021-03-23 (×3): qty 0.3

## 2021-03-23 MED ORDER — SODIUM CHLORIDE 0.9 % IV SOLN
INTRAVENOUS | Status: DC | PRN
  Administered 2021-03-23: 40 ug/min via INTRAVENOUS

## 2021-03-23 MED ORDER — CALCIUM CARBONATE ANTACID 500 MG PO CHEW
2.0000 | CHEWABLE_TABLET | Freq: Once | ORAL | Status: AC
  Administered 2021-03-23: 400 mg via ORAL
  Filled 2021-03-23: qty 2

## 2021-03-23 MED ORDER — HYDROCODONE-ACETAMINOPHEN 5-325 MG PO TABS
1.0000 | ORAL_TABLET | ORAL | Status: DC | PRN
  Administered 2021-03-23 – 2021-03-24 (×2): 2 via ORAL
  Administered 2021-03-25: 1 via ORAL
  Filled 2021-03-23 (×3): qty 2

## 2021-03-23 MED ORDER — ASCORBIC ACID 500 MG PO TABS
1000.0000 mg | ORAL_TABLET | Freq: Every day | ORAL | Status: DC
  Administered 2021-03-23 – 2021-03-25 (×3): 1000 mg via ORAL
  Filled 2021-03-23 (×3): qty 2

## 2021-03-23 MED ORDER — METHOCARBAMOL 500 MG PO TABS: 500.0000 mg | ORAL_TABLET | Freq: Four times a day (QID) | ORAL | Status: DC | PRN

## 2021-03-23 MED ORDER — LISINOPRIL 20 MG PO TABS
20.0000 mg | ORAL_TABLET | Freq: Every day | ORAL | Status: DC
  Administered 2021-03-23 – 2021-03-25 (×3): 20 mg via ORAL
  Filled 2021-03-23 (×3): qty 1

## 2021-03-23 MED ORDER — METOCLOPRAMIDE HCL 10 MG PO TABS: 5.0000 mg | ORAL_TABLET | Freq: Three times a day (TID) | ORAL | Status: DC | PRN

## 2021-03-23 MED ORDER — ALUM & MAG HYDROXIDE-SIMETH 200-200-20 MG/5ML PO SUSP: 30.0000 mL | ORAL | Status: DC | PRN

## 2021-03-23 MED ORDER — LACTATED RINGERS IV SOLN: INTRAVENOUS | Status: DC

## 2021-03-23 MED ORDER — BUPIVACAINE-EPINEPHRINE (PF) 0.25% -1:200000 IJ SOLN
INTRAMUSCULAR | Status: DC | PRN
  Administered 2021-03-23: 30 mL

## 2021-03-23 MED ORDER — CHLORHEXIDINE GLUCONATE 0.12 % MT SOLN: 15.0000 mL | Freq: Once | OROMUCOSAL | Status: AC

## 2021-03-23 MED ORDER — MORPHINE SULFATE (PF) 10 MG/ML IV SOLN
INTRAVENOUS | Status: DC | PRN
  Administered 2021-03-23: 10 mg

## 2021-03-23 MED ORDER — ACETAMINOPHEN 325 MG PO TABS: 325.0000 mg | ORAL_TABLET | Freq: Four times a day (QID) | ORAL | Status: DC | PRN

## 2021-03-23 MED ORDER — BUPIVACAINE HCL (PF) 0.5 % IJ SOLN
INTRAMUSCULAR | Status: AC
  Filled 2021-03-23: qty 10

## 2021-03-23 MED ORDER — MORPHINE SULFATE (PF) 2 MG/ML IV SOLN
0.5000 mg | INTRAVENOUS | Status: DC | PRN
  Administered 2021-03-23: 1 mg via INTRAVENOUS
  Filled 2021-03-23: qty 1

## 2021-03-23 MED ORDER — CALCIUM CARBONATE 1250 (500 CA) MG PO TABS
1.0000 | ORAL_TABLET | Freq: Every day | ORAL | Status: DC
  Administered 2021-03-23 – 2021-03-24 (×2): 500 mg via ORAL
  Filled 2021-03-23 (×3): qty 1

## 2021-03-23 MED ORDER — EPHEDRINE SULFATE 50 MG/ML IJ SOLN
INTRAMUSCULAR | Status: DC | PRN
  Administered 2021-03-23: 5 mg via INTRAVENOUS

## 2021-03-23 MED ORDER — VITAMIN B-12 1000 MCG PO TABS
1000.0000 ug | ORAL_TABLET | Freq: Every day | ORAL | Status: DC
  Administered 2021-03-23 – 2021-03-25 (×3): 1000 ug via ORAL
  Filled 2021-03-23 (×3): qty 1

## 2021-03-23 MED ORDER — CEFAZOLIN SODIUM 1 G IJ SOLR
INTRAMUSCULAR | Status: AC
  Filled 2021-03-23: qty 20

## 2021-03-23 MED ORDER — MAGNESIUM CITRATE PO SOLN
1.0000 | Freq: Once | ORAL | Status: DC | PRN
  Filled 2021-03-23: qty 296

## 2021-03-23 MED ORDER — HYDROCODONE-ACETAMINOPHEN 7.5-325 MG PO TABS: 1.0000 | ORAL_TABLET | ORAL | Status: DC | PRN

## 2021-03-23 MED ORDER — BUPIVACAINE LIPOSOME 1.3 % IJ SUSP
INTRAMUSCULAR | Status: AC
  Filled 2021-03-23: qty 20

## 2021-03-23 MED ORDER — BUPIVACAINE-EPINEPHRINE (PF) 0.25% -1:200000 IJ SOLN
INTRAMUSCULAR | Status: AC
  Filled 2021-03-23: qty 30

## 2021-03-23 MED ORDER — TRAMADOL HCL 50 MG PO TABS
50.0000 mg | ORAL_TABLET | Freq: Four times a day (QID) | ORAL | Status: DC
  Administered 2021-03-23 – 2021-03-25 (×8): 50 mg via ORAL
  Filled 2021-03-23 (×8): qty 1

## 2021-03-23 MED ORDER — ADULT MULTIVITAMIN W/MINERALS CH
1.0000 | ORAL_TABLET | Freq: Every day | ORAL | Status: DC
  Administered 2021-03-23 – 2021-03-25 (×3): 1 via ORAL
  Filled 2021-03-23 (×3): qty 1

## 2021-03-23 MED ORDER — PHENYLEPHRINE HCL (PRESSORS) 10 MG/ML IV SOLN
INTRAVENOUS | Status: DC | PRN
  Administered 2021-03-23: 80 ug via INTRAVENOUS
  Administered 2021-03-23: 160 ug via INTRAVENOUS
  Administered 2021-03-23 (×2): 80 ug via INTRAVENOUS
  Administered 2021-03-23: 20 ug via INTRAVENOUS
  Administered 2021-03-23: 120 ug via INTRAVENOUS

## 2021-03-23 MED ORDER — DEXAMETHASONE SODIUM PHOSPHATE 10 MG/ML IJ SOLN
INTRAMUSCULAR | Status: DC | PRN
  Administered 2021-03-23: 5 mg via INTRAVENOUS

## 2021-03-23 MED ORDER — DIPHENHYDRAMINE HCL 25 MG PO CAPS: 25.0000 mg | ORAL_CAPSULE | Freq: Every day | ORAL | Status: DC | PRN

## 2021-03-23 MED ORDER — CHLORHEXIDINE GLUCONATE 0.12 % MT SOLN
OROMUCOSAL | Status: AC
  Administered 2021-03-23: 15 mL via OROMUCOSAL
  Filled 2021-03-23: qty 15

## 2021-03-23 SURGICAL SUPPLY — 71 items
BLADE SAGITTAL 25.0X1.19X90 (BLADE) ×2 IMPLANT
BLADE SAW 90X13X1.19 OSCILLAT (BLADE) ×2 IMPLANT
BLOCK CUTTING FEMUR 3 RT MED (MISCELLANEOUS) ×2 IMPLANT
BLOCK CUTTING TIBIAL 2 RT (MISCELLANEOUS) ×2 IMPLANT
BLOCK CUTTING TIBIAL 4 RT MIS (MISCELLANEOUS) ×2 IMPLANT
BNDG ELASTIC 6X5.8 VLCR STR LF (GAUZE/BANDAGES/DRESSINGS) ×2 IMPLANT
CANISTER SUCT 1200ML W/VALVE (MISCELLANEOUS) ×2 IMPLANT
CANISTER WOUND CARE 500ML ATS (WOUND CARE) ×2 IMPLANT
CEMENT FEMORAL COMP SZ3 RT (Femur) ×2 IMPLANT
CEMENT HV SMART SET (Cement) ×4 IMPLANT
CHLORAPREP W/TINT 26 (MISCELLANEOUS) ×4 IMPLANT
COOLER POLAR GLACIER W/PUMP (MISCELLANEOUS) ×2 IMPLANT
COVER WAND RF STERILE (DRAPES) ×2 IMPLANT
CUFF TOURN SGL QUICK 24 (TOURNIQUET CUFF)
CUFF TOURN SGL QUICK 34 (TOURNIQUET CUFF)
CUFF TRNQT CYL 24X4X16.5-23 (TOURNIQUET CUFF) IMPLANT
CUFF TRNQT CYL 34X4.125X (TOURNIQUET CUFF) IMPLANT
DRAPE 3/4 80X56 (DRAPES) ×4 IMPLANT
DRSG MEPILEX SACRM 8.7X9.8 (GAUZE/BANDAGES/DRESSINGS) ×2 IMPLANT
ELECT CAUTERY BLADE 6.4 (BLADE) ×2 IMPLANT
ELECT REM PT RETURN 9FT ADLT (ELECTROSURGICAL) ×2
ELECTRODE REM PT RTRN 9FT ADLT (ELECTROSURGICAL) ×1 IMPLANT
FEMUR BONE MODEL (MISCELLANEOUS) ×2 IMPLANT
GAUZE SPONGE 4X4 12PLY STRL (GAUZE/BANDAGES/DRESSINGS) ×2 IMPLANT
GAUZE XEROFORM 1X8 LF (GAUZE/BANDAGES/DRESSINGS) ×2 IMPLANT
GLOVE SURG ORTHO LTX SZ8 (GLOVE) ×2 IMPLANT
GLOVE SURG SYN 9.0  PF PI (GLOVE) ×1
GLOVE SURG SYN 9.0 PF PI (GLOVE) ×1 IMPLANT
GLOVE SURG UNDER LTX SZ8 (GLOVE) ×2 IMPLANT
GLOVE SURG UNDER POLY LF SZ9 (GLOVE) ×2 IMPLANT
GOWN SRG 2XL LVL 4 RGLN SLV (GOWNS) ×1 IMPLANT
GOWN STRL NON-REIN 2XL LVL4 (GOWNS) ×1
GOWN STRL REUS W/ TWL LRG LVL3 (GOWN DISPOSABLE) ×1 IMPLANT
GOWN STRL REUS W/ TWL XL LVL3 (GOWN DISPOSABLE) ×1 IMPLANT
GOWN STRL REUS W/TWL LRG LVL3 (GOWN DISPOSABLE) ×1
GOWN STRL REUS W/TWL XL LVL3 (GOWN DISPOSABLE) ×1
HOLDER FOLEY CATH W/STRAP (MISCELLANEOUS) ×2 IMPLANT
HOOD PEEL AWAY FLYTE STAYCOOL (MISCELLANEOUS) ×4 IMPLANT
INSERT TIBIAL FLEX SZ2 RT H17 (Insert) ×2 IMPLANT
IRRIGATION SURGIPHOR STRL (IV SOLUTION) IMPLANT
IV NS IRRIG 3000ML ARTHROMATIC (IV SOLUTION) ×2 IMPLANT
KIT PREVENA INCISION MGT20CM45 (CANNISTER) ×2 IMPLANT
KIT TURNOVER KIT A (KITS) ×2 IMPLANT
MANIFOLD NEPTUNE II (INSTRUMENTS) ×2 IMPLANT
NDL SAFETY ECLIPSE 18X1.5 (NEEDLE) ×1 IMPLANT
NEEDLE HYPO 18GX1.5 SHARP (NEEDLE) ×1
NEEDLE SPNL 18GX3.5 QUINCKE PK (NEEDLE) ×2 IMPLANT
NEEDLE SPNL 20GX3.5 QUINCKE YW (NEEDLE) ×2 IMPLANT
NS IRRIG 1000ML POUR BTL (IV SOLUTION) ×2 IMPLANT
PACK TOTAL KNEE (MISCELLANEOUS) ×2 IMPLANT
PAD WRAPON POLAR KNEE (MISCELLANEOUS) ×1 IMPLANT
PATELLA RESURFACING MEDACTA 02 (Bone Implant) ×2 IMPLANT
PENCIL SMOKE EVACUATOR COATED (MISCELLANEOUS) ×2 IMPLANT
PULSAVAC PLUS IRRIG FAN TIP (DISPOSABLE) ×2
SCALPEL PROTECTED #10 DISP (BLADE) ×4 IMPLANT
STAPLER SKIN PROX 35W (STAPLE) ×2 IMPLANT
STEM EXTENSION 11MMX30MM (Stem) ×2 IMPLANT
SUCTION FRAZIER HANDLE 10FR (MISCELLANEOUS) ×1
SUCTION TUBE FRAZIER 10FR DISP (MISCELLANEOUS) ×1 IMPLANT
SUT DVC 2 QUILL PDO  T11 36X36 (SUTURE) ×1
SUT DVC 2 QUILL PDO T11 36X36 (SUTURE) ×1 IMPLANT
SUT ETHIBOND 2 V 37 (SUTURE) IMPLANT
SUT V-LOC 90 ABS DVC 3-0 CL (SUTURE) ×2 IMPLANT
SYR 20ML LL LF (SYRINGE) ×2 IMPLANT
SYR 50ML LL SCALE MARK (SYRINGE) ×4 IMPLANT
TIB TRAY SZ 2 R FIXED (Joint) ×2 IMPLANT
TIP FAN IRRIG PULSAVAC PLUS (DISPOSABLE) ×1 IMPLANT
TOWEL OR 17X26 4PK STRL BLUE (TOWEL DISPOSABLE) ×2 IMPLANT
TOWER CARTRIDGE SMART MIX (DISPOSABLE) ×2 IMPLANT
TRAY FOLEY MTR SLVR 16FR STAT (SET/KITS/TRAYS/PACK) ×2 IMPLANT
WRAPON POLAR PAD KNEE (MISCELLANEOUS) ×2

## 2021-03-23 NOTE — H&P (Signed)
Chief Complaint  Patient presents with  . Pre-op Exam  Right TKA scheduled 03/23/21 with Dr. Rosita Kea   Reason for Visit Breanna Hanson is a 70 y.o. who presents today for a history and physical for right total knee arthroplasty with Dr. Kennedy Bucker on 03/23/2021. Last seen in our clinic on 02/17/2021. There is been no change in her condition since that time. Patient states that she has had problems with the right knee for quite some time.The patient states her right knee is painful. She has had injections in the past. She had a right knee arthroscopy for meniscus repair, and subchondroplasty for a stress fracture. The patient states her right knee pain has gradually gotten worse to the point that she limps all the time. She locates her pain to the medial aspect of her right knee. She states she can not walk 1 mile without sitting down and resting. Patient states his pain is increased to the point that is significant with her activities of daily living and she wishes to proceed with having total knee arthroplasty.  Past Medical History Past Medical History:  Diagnosis Date  . GERD (gastroesophageal reflux disease)  . History of solitary pulmonary nodule  thought to be bronchogenic cyst, unchanged in size since initial documentation in 1980's per patient.  . Hyperlipidemia  . Hypertension  . Hypothyroidism  . Osteopenia of multiple sites 09/07/2018  . Vitamin B12 deficiency 02/18/2016   Past Surgical History Past Surgical History:  Procedure Laterality Date  . CESAREAN SECTION  . CHOLECYSTECTOMY 08/1998  . COLONOSCOPY 01/22/2007  Dr. Eber Hong @ [facility] - Diverticulosis, FHPolyps(f)  . COLONOSCOPY 08/14/2017  Diverticulosis/FHx CP-Father/Repeat 67yrs/MUS  . knee arthroscopy with subchondroplasty with partial medial menisectomy Right 03/22/2018  Dr.Cadell Gabrielson   Past Family History Family History  Problem Relation Age of Onset  . Coronary Artery Disease (Blocked arteries around heart) Mother  .  Diabetes Mother  . Colon polyps Father   Medications Current Outpatient Medications Ordered in Epic  Medication Sig Dispense Refill  . acetaminophen (TYLENOL) 500 MG tablet Take 1,000 mg by mouth as needed for Pain  . ascorbic acid, vitamin C, (VITAMIN C) 1000 MG tablet Take 1,000 mg by mouth once daily  . calcium carbonate/vitamin D3 (CALTRATE WITH VITAMIN D3 ORAL) Take by mouth Takes 600 mg daily  . cephalexin (KEFLEX) 500 MG capsule TAKE 4 PILLS ONE HOUR PRIOR TO DENTAL APPOINTMENT. 12 capsule 1  . cyanocobalamin (VITAMIN B12) 1000 MCG tablet Take 1,000 mcg by mouth once daily Takes 3 times a week  . diphenhydrAMINE (BENADRYL) 25 mg tablet Take 25 mg by mouth nightly as needed  . hydroCHLOROthiazide (HYDRODIURIL) 25 MG tablet TAKE 1 TABLET BY MOUTH EVERY DAY 90 tablet 3  . ibuprofen (ADVIL,MOTRIN) 200 MG tablet Take 400 mg by mouth as needed for Pain  . KRILL OIL ORAL Take 500 mg by mouth 2 (two) times daily  . levothyroxine (SYNTHROID) 112 MCG tablet TAKE 1 TABLET ONCE DAILY ON EMPTY STOMACH WITH GLASS OF WATER AT LEAST 30-60 MINUTE BEFORE BREAKFAST 90 tablet 1  . lisinopriL (ZESTRIL) 20 MG tablet TAKE 1 TABLET BY MOUTH EVERY DAY 90 tablet 3  . loratadine (CLARITIN) 10 mg tablet Take by mouth Take 10 mg by mouth daily as needed for allergies.  . multivitamin tablet Take 1 tablet by mouth once daily Centrum silver for women over 50  . naproxen sodium (ALEVE, ANAPROX) 220 MG tablet Take by mouth Take 220 mg by mouth daily as  needed (pain).  . simvastatin (ZOCOR) 40 MG tablet TAKE 1 TABLET BY MOUTH EVERY NIGHT FOR CHOLESTEROL 90 tablet 1  . triamcinolone 0.1 % cream APPLY TWICE DAILY TO AFFECTED AREAS UNTIL CLEAR, THEN AS NEEDED FOR FLARES   No current Epic-ordered facility-administered medications on file.   Allergies Allergies  Allergen Reactions  . Fish Oil Other (See Comments)  dyspepsia    Review of Systems A comprehensive 14 point ROS was performed, reviewed, and the pertinent  orthopaedic findings are documented in the HPI.  Exam BP 138/86  Ht 156.2 cm (5' 1.5")  Wt 76.4 kg (168 lb 6.4 oz)  LMP (LMP Unknown)  BMI 31.30 kg/m   General: Well-developed well-nourished female seen in no acute distress.   HEENT: Atraumatic,normocephalic. Pupils are equal and reactive to light. Oropharynx is clear with moist mucosa  Lungs: Clear to auscultation bilaterally   Cardiovascular: Regular rate and rhythm. Normal S1, S2. No murmurs. No appreciable gallops or rubs. Peripheral pulses are palpable.  Abdomen: Soft, non-tender, nondistended. Bowel sounds present  Extremity:  Orthopaedic Examination:  Right Left      Gait Normal  Alignment Normal  Inspection Large amount of swelling of the right knee Normal  Palpation Knee Normal Normal  Range of Motion Knee 10 to 120 degrees with crepitation Normal  Strength Normal Normal  Meniscus Exam Normal Normal  Ligament Exam Normal Normal  Patella Exam Normal Normal  Reflexes Normal Normal  Neurologic Normal Normal    Neurological:  The patient is alert and oriented Sensation to light touch appears to be intact and within normal limits Gross motor strength appeared to be equal to 5/5  Vascular :  Peripheral pulses felt to be palpable. Capillary refill appears to be intact and within normal limits  X-Deady  1. X-rays of the right knee taken on 12/23/2020 in [place] [facility] reviewed by me today show complete bone loss to the medial compartment space. There is also some erosion to the tibial plateau. Increased sclerotic bone formation is noted. Is noted to have some arthritic changes in a tricompartmental fashion.  Impression  1. Degenerative arthrosis right knee  Plan   41. 70 year old female with severe right knee osteoarthritis with complete loss of joint space in the medial compartment. Pain interferes with quality of life and activities day living. Risk, benefits, complications of a right total knee  arthroplasty have been discussed with the patient. Patient has agreed and consented procedure with Dr. Kennedy Bucker on 03/23/2021.  This note was generated in part with voice recognition software and I apologize for any typographical errors that were not detected and corrected   Electronically signed by Patience Musca, PA at 03/18/2021 11:02 AM EDT   Reviewed  H+P. No changes noted.

## 2021-03-23 NOTE — Transfer of Care (Signed)
Immediate Anesthesia Transfer of Care Note  Patient: [name]  Procedure(s) Performed: TOTAL KNEE ARTHROPLASTY - [place] Neighbor to Assist (Right Knee)  Patient Location: PACU  Anesthesia Type:Spinal  Level of Consciousness: awake, alert , oriented and patient cooperative  Airway & Oxygen Therapy: Patient Spontanous Breathing  Post-op Assessment: Report given to RN and Post -op Vital signs reviewed and stable  Post vital signs: Reviewed and stable  Last Vitals:  Vitals Value Taken Time  BP 90/59 03/23/21 0930  Temp    Pulse 77 03/23/21 0933  Resp 20 03/23/21 0933  SpO2 99 % 03/23/21 0933  Vitals shown include unvalidated device data.  Last Pain:  Vitals:   03/23/21 0628  TempSrc: Temporal  PainSc: 0-No pain         Complications: No complications documented.

## 2021-03-23 NOTE — Anesthesia Preprocedure Evaluation (Signed)
Anesthesia Evaluation  Patient identified by MRN, date of birth, ID band Patient awake    Reviewed: Allergy & Precautions, NPO status , Patient's Chart, lab work & pertinent test results  History of Anesthesia Complications (+) PONV and history of anesthetic complications  Airway Mallampati: II       Dental   Pulmonary neg sleep apnea, neg COPD, Not current smoker,           Cardiovascular hypertension, Pt. on medications (-) Past MI and (-) CHF (-) dysrhythmias (-) Valvular Problems/Murmurs     Neuro/Psych neg Seizures    GI/Hepatic Neg liver ROS, GERD  Medicated and Controlled,  Endo/Other  neg diabetesHypothyroidism   Renal/GU negative Renal ROS     Musculoskeletal   Abdominal   Peds  Hematology   Anesthesia Other Findings   Reproductive/Obstetrics                             Anesthesia Physical Anesthesia Plan  ASA: II  Anesthesia Plan: Spinal   Post-op Pain Management:    Induction:   PONV Risk Score and Plan:   Airway Management Planned:   Additional Equipment:   Intra-op Plan:   Post-operative Plan:   Informed Consent: I have reviewed the patients History and Physical, chart, labs and discussed the procedure including the risks, benefits and alternatives for the proposed anesthesia with the patient or authorized representative who has indicated his/her understanding and acceptance.       Plan Discussed with:   Anesthesia Plan Comments:         Anesthesia Quick Evaluation

## 2021-03-23 NOTE — Op Note (Signed)
03/23/2021  9:37 AM  PATIENT:  Breanna Hanson   MRN: [id number]  PRE-OPERATIVE DIAGNOSIS:  Primary localized osteoarthritis of right knee   POST-OPERATIVE DIAGNOSIS:  Same   PROCEDURE:  Procedure(s): Right TOTAL KNEE ARTHROPLASTY   SURGEON: Leitha Schuller, MD   ASSISTANTS: Cranston Neighbor, PA-C   ANESTHESIA:   spinal   EBL:   100  BLOOD ADMINISTERED:none   DRAINS: Incisional wound VAC    LOCAL MEDICATIONS USED:  MARCAINE    and OTHER Exparel and morphine   SPECIMEN:  No Specimen   DISPOSITION OF SPECIMEN:  N/A   COUNTS:  YES   TOURNIQUET:   58 at 300 mm Hg   IMPLANTS: Medacta  GMK sphere system with  3 right femur, 2 right tibia with short stem and  17 mm insert.  Size 2 patella, all components cemented.   DICTATION: Reubin Milan Dictation   patient was brought to the operating room and spinal anesthesia was obtained.  After prepping and draping the  right leg in sterile fashion, and after patient identification and timeout procedures were completed, tourniquet was raised  and midline skin incision was made followed by medial parapatellar arthrotomy with  severe medial compartment osteoarthritis, advanced patellofemoral arthritis and  mild lateral compartment arthritis, partial synovectomy was also carried out.   The ACL and PCL and fat pad were excised along with anterior horns of the meniscus. The proximal tibia cutting guide from  the [facility] system was applied and the proximal tibia cut carried out.  The distal femoral cut was carried out in a similar fashion     The  3 femoral cutting guide applied with anterior posterior and chamfer cuts made.  The posterior horns of the menisci were removed at this point.   Injection of the above medication was carried out after the femoral and tibial cuts were carried out.  The 2 baseplate trial was placed pinned into position and proximal tibial preparation carried out with drilling hand reaming and the keel punch followed by placement of the  3  femur and sizing the tibial insert size   17 millimeter gave the best fit with stability and full extension.  The distal femoral drill holes were made in the notch cut for the trochlear groove was then carried out with trials were then removed the patella was cut using the patellar cutting guide and it sized to a size 2after drill holes have been made  The knee was irrigated with pulsatile lavage and the bony surfaces dried the tibial component was cemented into place first.  Excess cement was removed and the polyethylene insert placed with a torque screw placed with a torque screwdriver tightened.  The distal femoral component was placed and the knee was held in extension as the patellar button was clamped into place.  After the cement was set, excess cement was removed and the knee was again irrigated thoroughly thoroughly irrigated.  The tourniquet was let down and hemostasis checked with electrocautery. The arthrotomy was repaired with a heavy Quill suture,  followed by 3-0 V lock subcuticular closure, skin staples followed by incisional wound VAC and Polar Care.Marland Kitchen   PLAN OF CARE: Admit for overnight observation   PATIENT DISPOSITION:  PACU - hemodynamically stable.

## 2021-03-23 NOTE — Progress Notes (Signed)
Pt states burning in stomach comes and goes. Pt states burning relieved some by crackers. Pt also given tums. Dr. Henrene Hawking notified. Stated patient could now go to her room: 140.

## 2021-03-23 NOTE — Anesthesia Procedure Notes (Signed)
Spinal  Start time: 03/23/2021 7:25 AM End time: 03/23/2021 7:42 AM Staffing Performed: other anesthesia staff  Anesthesiologist: Naomie Dean, MD Other anesthesia staff: Waunita Schooner, RN Preanesthetic Checklist Completed: patient identified, IV checked, site marked, risks and benefits discussed, surgical consent, monitors and equipment checked, pre-op evaluation and timeout performed Spinal Block Patient position: sitting Prep: Betadine and site prepped and draped Patient monitoring: heart rate, cardiac monitor, continuous pulse ox and blood pressure Approach: midline Location: L3-4 Injection technique: single-shot Needle Needle type: Pencan  Needle gauge: 24 G Needle length: 10 cm Assessment Sensory level: T10 Additional Notes CSF swirl noted prior to injection and after injection complete.

## 2021-03-23 NOTE — Evaluation (Signed)
Physical Therapy Evaluation Patient Details Name: Breanna Hanson MRN: [id number] DOB: 01-26-1951 Today's Date: 03/23/2021   History of Present Illness  Pt is a 70 yo female s/p R TKA, WBAT. PMH of GERD,HTN, HLD, L THA.  Clinical Impression  Patient A&Ox4, reported 6/10 R knee pain, premedicated prior to session. Pt reported at baseline she is independent, and will have significant family support/assistance at discharge if needed.   The patient was able to perform several supine exercises with verbal and tactile cues, AAROM for heel slides due to pain. Supine to sit with HOB elevated and minA for RLE assist, however pt reported "I feel like I am going to pass out" so immediately returned to supine, reported near resolvement once resting. Pt agreeable to re-attempt, but vomited, and further mobility deferred.  Overall the patient demonstrated deficits (see "PT Problem List") that impede the patient's functional abilities, safety, and mobility and would benefit from skilled PT intervention. Recommendation is HHPT with supervision/assistance 24/7 pending pt progress.     Follow Up Recommendations Home health PT;Supervision/Assistance - 24 hour    Equipment Recommendations  None recommended by PT    Recommendations for Other Services       Precautions / Restrictions Precautions Precautions: Fall;Knee Precaution Booklet Issued: Yes (comment) Restrictions Weight Bearing Restrictions: Yes RLE Weight Bearing: Weight bearing as tolerated      Mobility  Bed Mobility Overal bed mobility: Needs Assistance Bed Mobility: Supine to Sit;Sit to Supine     Supine to sit: Min assist;HOB elevated Sit to supine: Min assist;HOB elevated   General bed mobility comments: minA to return to supine quickly due to pt stating " I feel like I am going to pass out"    Transfers                 General transfer comment: deferred due to pt nausea, feeling like she was going to pass  out  Ambulation/Gait                Stairs            Wheelchair Mobility    Modified Rankin (Stroke Patients Only)       Balance Overall balance assessment: Needs assistance Sitting-balance support: Feet supported;Bilateral upper extremity supported Sitting balance-Leahy Scale: Fair                                       Pertinent Vitals/Pain Pain Assessment: 0-10 Pain Score: 6  Pain Location: R knee Pain Descriptors / Indicators: Aching;Sore;Grimacing Pain Intervention(s): Limited activity within patient's tolerance;Monitored during session;Premedicated before session;Repositioned;Ice applied    Home Living Family/patient expects to be discharged to:: Private residence Living Arrangements: Spouse/significant other Available Help at Discharge: Family;Available 24 hours/day (daughter lives nearby to assist as well) Type of Home: House Home Access: Stairs to enter Entrance Stairs-Rails: None Entrance Stairs-Number of Steps: 2 Home Layout: One level Home Equipment: Environmental consultant - 2 wheels;Shower seat - built in;Cane - single point      Prior Function Level of Independence: Independent               Hand Dominance   Dominant Hand: Right    Extremity/Trunk Assessment   Upper Extremity Assessment Upper Extremity Assessment: Overall WFL for tasks assessed    Lower Extremity Assessment Lower Extremity Assessment: RLE deficits/detail;LLE deficits/detail RLE Deficits / Details: s/p TKA LLE Deficits / Details: WFLs  Communication   Communication: No difficulties  Cognition Arousal/Alertness: Awake/alert Behavior During Therapy: WFL for tasks assessed/performed Overall Cognitive Status: Within Functional Limits for tasks assessed                                        General Comments      Exercises Total Joint Exercises Ankle Circles/Pumps: AROM;Both;10 reps Quad Sets: AROM;Strengthening;Right;10 reps Heel  Slides: AAROM;Strengthening;Right;10 reps Hip ABduction/ADduction: AAROM;Strengthening;Right;10 reps Straight Leg Raises: AAROM;Strengthening;Right;5 reps Other Exercises Other Exercises: Pt educated on PT role, POC, importance of TKE at rest   Assessment/Plan    PT Assessment Patient needs continued PT services  PT Problem List Decreased strength;Decreased mobility;Decreased range of motion;Decreased activity tolerance;Decreased balance;Decreased knowledge of precautions;Pain;Decreased knowledge of use of DME       PT Treatment Interventions DME instruction;Therapeutic exercise;Gait training;Balance training;Neuromuscular re-education;Functional mobility training;Stair training;Therapeutic activities;Patient/family education    PT Goals (Current goals can be found in the Care Plan section)  Acute Rehab PT Goals Patient Stated Goal: to feel better PT Goal Formulation: With patient Time For Goal Achievement: 04/06/21 Potential to Achieve Goals: Good    Frequency BID   Barriers to discharge        Co-evaluation               AM-PAC PT "6 Clicks" Mobility  Outcome Measure Help needed turning from your back to your side while in a flat bed without using bedrails?: A Little Help needed moving from lying on your back to sitting on the side of a flat bed without using bedrails?: A Little Help needed moving to and from a bed to a chair (including a wheelchair)?: A Little Help needed standing up from a chair using your arms (e.g., wheelchair or bedside chair)?: A Little Help needed to walk in hospital room?: A Little Help needed climbing 3-5 steps with a railing? : A Lot 6 Click Score: 17    End of Session Equipment Utilized During Treatment: Gait belt Activity Tolerance: Other (comment) (limited by nausea, dizziness with mobility) Patient left: in bed;with call bell/phone within reach;with bed alarm set;with family/visitor present Nurse Communication: Mobility status PT  Visit Diagnosis: Other abnormalities of gait and mobility (R26.89);Difficulty in walking, not elsewhere classified (R26.2);Pain;Muscle weakness (generalized) (M62.81) Pain - Right/Left: Right Pain - part of body: Knee    Time: 2956-2130 PT Time Calculation (min) (ACUTE ONLY): 36 min   Charges:   PT Evaluation $PT Eval Low Complexity: 1 Low PT Treatments $Therapeutic Exercise: 23-37 mins        Olga Coaster PT, DPT 4:15 PM,03/23/21

## 2021-03-23 NOTE — Progress Notes (Signed)
Pt c/o burning like acid refulx. Pt states she has some reflux at times. Dr. Henrene Hawking notified. Acknowledged. Orders received.

## 2021-03-24 DIAGNOSIS — Z8371 Family history of colonic polyps: Secondary | ICD-10-CM | POA: Diagnosis not present

## 2021-03-24 DIAGNOSIS — Z9049 Acquired absence of other specified parts of digestive tract: Secondary | ICD-10-CM | POA: Diagnosis not present

## 2021-03-24 DIAGNOSIS — Z833 Family history of diabetes mellitus: Secondary | ICD-10-CM | POA: Diagnosis not present

## 2021-03-24 DIAGNOSIS — E538 Deficiency of other specified B group vitamins: Secondary | ICD-10-CM | POA: Diagnosis present

## 2021-03-24 DIAGNOSIS — E785 Hyperlipidemia, unspecified: Secondary | ICD-10-CM | POA: Diagnosis present

## 2021-03-24 DIAGNOSIS — Z79890 Hormone replacement therapy (postmenopausal): Secondary | ICD-10-CM | POA: Diagnosis not present

## 2021-03-24 DIAGNOSIS — Z91018 Allergy to other foods: Secondary | ICD-10-CM | POA: Diagnosis not present

## 2021-03-24 DIAGNOSIS — M1711 Unilateral primary osteoarthritis, right knee: Secondary | ICD-10-CM | POA: Diagnosis present

## 2021-03-24 DIAGNOSIS — Z8249 Family history of ischemic heart disease and other diseases of the circulatory system: Secondary | ICD-10-CM | POA: Diagnosis not present

## 2021-03-24 DIAGNOSIS — K219 Gastro-esophageal reflux disease without esophagitis: Secondary | ICD-10-CM | POA: Diagnosis present

## 2021-03-24 DIAGNOSIS — Z79899 Other long term (current) drug therapy: Secondary | ICD-10-CM | POA: Diagnosis not present

## 2021-03-24 DIAGNOSIS — E039 Hypothyroidism, unspecified: Secondary | ICD-10-CM | POA: Diagnosis present

## 2021-03-24 DIAGNOSIS — R911 Solitary pulmonary nodule: Secondary | ICD-10-CM | POA: Diagnosis present

## 2021-03-24 DIAGNOSIS — M8589 Other specified disorders of bone density and structure, multiple sites: Secondary | ICD-10-CM | POA: Diagnosis present

## 2021-03-24 DIAGNOSIS — I10 Essential (primary) hypertension: Secondary | ICD-10-CM | POA: Diagnosis present

## 2021-03-24 DIAGNOSIS — K5790 Diverticulosis of intestine, part unspecified, without perforation or abscess without bleeding: Secondary | ICD-10-CM | POA: Diagnosis present

## 2021-03-24 DIAGNOSIS — R110 Nausea: Secondary | ICD-10-CM | POA: Diagnosis not present

## 2021-03-24 LAB — CBC
HCT: 33.4 % — ABNORMAL LOW (ref 36.0–46.0)
Hemoglobin: 11.7 g/dL — ABNORMAL LOW (ref 12.0–15.0)
MCH: 30.7 pg (ref 26.0–34.0)
MCHC: 35 g/dL (ref 30.0–36.0)
MCV: 87.7 fL (ref 80.0–100.0)
Platelets: 193 10*3/uL (ref 150–400)
RBC: 3.81 MIL/uL — ABNORMAL LOW (ref 3.87–5.11)
RDW: 12.6 % (ref 11.5–15.5)
WBC: 9.9 10*3/uL (ref 4.0–10.5)
nRBC: 0 % (ref 0.0–0.2)

## 2021-03-24 LAB — BASIC METABOLIC PANEL
Anion gap: 8 (ref 5–15)
BUN: 17 mg/dL (ref 8–23)
CO2: 26 mmol/L (ref 22–32)
Calcium: 9.1 mg/dL (ref 8.9–10.3)
Chloride: 102 mmol/L (ref 98–111)
Creatinine, Ser: 1.21 mg/dL — ABNORMAL HIGH (ref 0.44–1.00)
GFR, Estimated: 49 mL/min — ABNORMAL LOW (ref >60)
Glucose, Bld: 132 mg/dL — ABNORMAL HIGH (ref 70–99)
Potassium: 4.7 mmol/L (ref 3.5–5.1)
Sodium: 136 mmol/L (ref 135–145)

## 2021-03-24 NOTE — Evaluation (Signed)
Occupational Therapy Evaluation Patient Details Name: Breanna Hanson MRN: [id number] DOB: 1950-10-29 Today's Date: 03/24/2021    History of Present Illness Pt is a 70 yo female s/p R TKA, WBAT. PMH of GERD,HTN, HLD, L THA.   Clinical Impression   Breanna Hanson was seen for OT evaluation this date. Prior to hospital admission, pt was Independent for mobility and ADLs. Pt lives with supportive husband available 24/7, sister lives nearby. Pt presents to acute OT demonstrating near baseline ADL performance. SUPERVISION + RW for ADL t/f and standing grooming reaching outside BOS. MOD I don/doff B socks seated EOB. Anticipate assist for compression stocking mgmt only. Pt and family instructed in polar care mgt, falls prevention strategies, home/routines modifications, DME/AE for LB bathing/dressing tasks, and compression stocking mgt. No further skilled acute OT needs, will sign off. Do not currently anticipate any OT needs following this hospitalization.      Follow Up Recommendations  No OT follow up;Supervision - Intermittent    Equipment Recommendations  None recommended by OT    Recommendations for Other Services       Precautions / Restrictions Precautions Precautions: Fall;Knee Precaution Booklet Issued: Yes (comment) Restrictions Weight Bearing Restrictions: Yes RLE Weight Bearing: Weight bearing as tolerated      Mobility Bed Mobility               General bed mobility comments: NT, pt in recliner    Transfers Overall transfer level: Needs assistance Equipment used: Rolling walker (2 wheeled) Transfers: Sit to/from Stand Sit to Stand: Min guard          Balance Overall balance assessment: Needs assistance Sitting-balance support: Bilateral upper extremity supported Sitting balance-Leahy Scale: Good     Standing balance support: Bilateral upper extremity supported;During functional activity Standing balance-Leahy Scale: Fair Standing balance comment: reaching  outside BOS                           ADL either performed or assessed with clinical judgement   ADL Overall ADL's : Modified independent                                       General ADL Comments: SUPERVISION + RW for ADL t/f and standing grooming reaching outside BOS. MOD I don/doff B socks seated EOB. Anticipate assist for compression stocking mgmt only.                  Pertinent Vitals/Pain Pain Assessment: No/denies pain Pain Score: 4  Pain Location: R knee Pain Descriptors / Indicators: Aching;Sore Pain Intervention(s): Premedicated before session;Monitored during session;Repositioned;Ice applied     Hand Dominance Right   Extremity/Trunk Assessment Upper Extremity Assessment Upper Extremity Assessment: Overall WFL for tasks assessed   Lower Extremity Assessment Lower Extremity Assessment: Generalized weakness       Communication Communication Communication: No difficulties   Cognition Arousal/Alertness: Awake/alert Behavior During Therapy: WFL for tasks assessed/performed Overall Cognitive Status: Within Functional Limits for tasks assessed                                     General Comments       Exercises Exercises: Other exercises Other Exercises Other Exercises: 90 deg R turn training to prevent CKC twisting on the R knee Other Exercises:  Positioning review with pt and spouse to promote R knee ext PROM Other Exercises: Car transfer sequencing review with pt and spouse verbally and with visual simulation   Shoulder Instructions      Home Living Family/patient expects to be discharged to:: Private residence Living Arrangements: Spouse/significant other Available Help at Discharge: Family;Available 24 hours/day Type of Home: House Home Access: Stairs to enter [employer] of Steps: 2 Entrance Stairs-Rails: None Home Layout: One level     Bathroom Shower/Tub: Higher education careers adviser: Handicapped height     Home Equipment: Environmental consultant - 2 wheels;Shower seat - built in;Cane - single point          Prior Functioning/Environment Level of Independence: Independent                 OT Problem List: Decreased range of motion;Impaired balance (sitting and/or standing)         OT Goals(Current goals can be found in the care plan section) Acute Rehab OT Goals Patient Stated Goal: to feel better OT Goal Formulation: With patient/family Time For Goal Achievement: 04/07/21 Potential to Achieve Goals: Good   AM-PAC OT "6 Clicks" Daily Activity     Outcome Measure Help from another person eating meals?: None Help from another person taking care of personal grooming?: A Little Help from another person toileting, which includes using toliet, bedpan, or urinal?: A Little Help from another person bathing (including washing, rinsing, drying)?: A Little Help from another person to put on and taking off regular upper body clothing?: None Help from another person to put on and taking off regular lower body clothing?: None 6 Click Score: 21   End of Session Equipment Utilized During Treatment: Rolling walker  Activity Tolerance: Patient tolerated treatment well Patient left: in chair;with call bell/phone within reach;with chair alarm set;with family/visitor present  OT Visit Diagnosis: Other abnormalities of gait and mobility (R26.89)                Time: 4431-5400 OT Time Calculation (min): 23 min Charges:  OT General Charges $OT Visit: 1 Visit OT Evaluation $OT Eval Low Complexity: 1 Low OT Treatments $Self Care/Home Management : 8-22 mins   Kathie Dike, M.S. OTR/L  03/24/21, 3:37 PM  ascom [phone]

## 2021-03-24 NOTE — Anesthesia Postprocedure Evaluation (Signed)
Anesthesia Post Note  Patient: Breanna Hanson  Procedure(s) Performed: TOTAL KNEE ARTHROPLASTY - [place] Neighbor to Assist (Right Knee)  Patient location during evaluation: Nursing Unit Anesthesia Type: Spinal Level of consciousness: oriented and awake and alert Pain management: pain level controlled Vital Signs Assessment: post-procedure vital signs reviewed and stable Respiratory status: spontaneous breathing and respiratory function stable Cardiovascular status: blood pressure returned to baseline and stable Postop Assessment: no headache, no backache, no apparent nausea or vomiting and patient able to bend at knees Anesthetic complications: no   No complications documented.   Last Vitals:  Vitals:   03/24/21 0538 03/24/21 0818  BP: (!) 115/56 130/68  Pulse: 67 64  Resp: 16 17  Temp: (!) 36.3 C 36.5 C  SpO2: 100% 99%    Last Pain:  Vitals:   03/24/21 0818  TempSrc: Oral  PainSc:                  Starling Manns

## 2021-03-24 NOTE — Progress Notes (Signed)
Met with the patient to discuss DC plan ands needs She lives with her husband She needs a rolling walker and a 3 in 1 She is set up with [facility] for Home health She has transportation and can afford her medications

## 2021-03-24 NOTE — Progress Notes (Signed)
   Subjective: 1 Day Post-Op Procedure(s) (LRB): TOTAL KNEE ARTHROPLASTY - [place] Neighbor to Assist (Right) Patient reports pain as moderate.   Patient is well, but has had some minor complaints of nausea and vomiting Denies any CP, SOB, ABD pain. We will continue therapy today.  Plan is to go Home after hospital stay.  Objective: Vital signs in last 24 hours: Temp:  [97 F (36.1 C)-98 F (36.7 C)] 97.4 F (36.3 C) (06/01 0538) Pulse Rate:  [64-82] 67 (06/01 0538) Resp:  [16-22] 16 (06/01 0538) BP: (90-137)/(54-73) 115/56 (06/01 0538) SpO2:  [97 %-100 %] 100 % (06/01 0538)  Intake/Output from previous day: 05/31 0701 - 06/01 0700 In: 1815.5 [P.O.:340; I.V.:1275.5; IV Piggyback:200] Out: 1150 [Urine:1050; Blood:100] Intake/Output this shift: No intake/output data recorded.  Recent Labs    03/23/21 1245 03/24/21 0450  HGB 12.1 11.7*   Recent Labs    03/23/21 1245 03/24/21 0450  WBC 8.9 9.9  RBC 3.99 3.81*  HCT 35.3* 33.4*  PLT 182 193   Recent Labs    03/23/21 1245 03/24/21 0450  NA  --  136  K  --  4.7  CL  --  102  CO2  --  26  BUN  --  17  CREATININE 1.23* 1.21*  GLUCOSE  --  132*  CALCIUM  --  9.1   No results for input(s): LABPT, INR in the last 72 hours.  EXAM General - Patient is Alert, Appropriate and Oriented Extremity - Neurovascular intact Sensation intact distally Intact pulses distally Dorsiflexion/Plantar flexion intact Dressing - dressing C/D/I and no drainage, prevena intact with out drainage Motor Function - intact, moving foot and toes well on exam.   Past Medical History:  Diagnosis Date  . Arthritis   . Elevated lipids   . GERD (gastroesophageal reflux disease)   . Hypertension   . Hypothyroidism   . PONV (postoperative nausea and vomiting)   . Pulmonary nodule     Assessment/Plan:   1 Day Post-Op Procedure(s) (LRB): TOTAL KNEE ARTHROPLASTY - [place] Neighbor to Assist (Right) Active Problems:   S/P TKR (total knee  replacement) using cement, right  Estimated body mass index is 30.91 kg/m as calculated from the following:   Height as of this encounter: 5\' 2"  (1.575 m).   Weight as of this encounter: 76.7 kg. Advance diet Up with therapy  Work on and VSS Nausea - continue to monitor. Zofran and reglan PRN CM to assist with discharge to home with HHPT  DVT Prophylaxis - Lovenox, TED hose and SCDs Weight-Bearing as tolerated to right leg   [employer]. [employer], PA-C [facility] 03/24/2021, 7:54 AM

## 2021-03-24 NOTE — Progress Notes (Signed)
Physical Therapy Treatment Patient Details Name: Breanna Hanson MRN: [id number] DOB: July 19, 1951 Today's Date: 03/24/2021    History of Present Illness Pt is a 70 yo female s/p R TKA, WBAT. PMH of GERD,HTN, HLD, L THA.    PT Comments    Pt was pleasant and motivated to participate during the session but was limited by nausea with mobility and to a lesser extent dizziness.  Pt's recent BP in supine was 130/68.  BP taken in sitting at 120/67 and then in standing at 113/70.  Pt's dizziness reported as mild and did not worsen with remaining in upright but nausea did progress to were mobility was limited, nursing aware.  Pt is expected to make good progress towards goals once nausea is resolved. Pt will benefit from HHPT upon discharge to safely address deficits listed in patient problem list for decreased caregiver assistance and eventual return to PLOF.    Follow Up Recommendations  Home health PT;Supervision/Assistance - 24 hour     Equipment Recommendations  Rolling walker with 5" wheels;3in1 (PT)    Recommendations for Other Services       Precautions / Restrictions Precautions Precautions: Fall;Knee Restrictions Weight Bearing Restrictions: Yes RLE Weight Bearing: Weight bearing as tolerated    Mobility  Bed Mobility Overal bed mobility: Modified Independent             General bed mobility comments: Extra time and effort only during sup to sit but no physical assistance required    Transfers Overall transfer level: Needs assistance Equipment used: Rolling walker (2 wheeled) Transfers: Sit to/from Stand Sit to Stand: Min guard         General transfer comment: Min to mod verbal and visual cues for sequencing for hand and R foot placement  Ambulation/Gait Ambulation/Gait assistance: Min guard Gait Distance (Feet): 3 Feet Assistive device: Rolling walker (2 wheeled) Gait Pattern/deviations: Step-to pattern;Trunk flexed Gait velocity: decreased   General Gait  Details: Pt limited to several steps from bed to chair secondary to nausea; pt steady with no LOB or R knee buckling   Stairs             Wheelchair Mobility    Modified Rankin (Stroke Patients Only)       Balance Overall balance assessment: Needs assistance Sitting-balance support: Bilateral upper extremity supported Sitting balance-Leahy Scale: Good     Standing balance support: Bilateral upper extremity supported;During functional activity Standing balance-Leahy Scale: Fair                              Cognition Arousal/Alertness: Awake/alert Behavior During Therapy: WFL for tasks assessed/performed Overall Cognitive Status: Within Functional Limits for tasks assessed                                        Exercises Total Joint Exercises Quad Sets: AROM;Strengthening;Right;10 reps;5 reps Straight Leg Raises: AAROM;Strengthening;Right;5 reps Long Arc Quad: AROM;Strengthening;Both;10 reps;15 reps Knee Flexion: AROM;Strengthening;Both;10 reps;15 reps Goniometric ROM: R knee AROM: 2-79 deg Marching in Standing: AROM;Strengthening;Both;5 reps;Standing Other Exercises Other Exercises: HEP education/review per handout for RLE QS and seated knee flex Other Exercises: Positioning education to promoted R knee ext PROM    General Comments        Pertinent Vitals/Pain Pain Assessment: 0-10 Pain Score: 6  Pain Location: R knee Pain Descriptors / Indicators: Aching;Sore Pain  Intervention(s): Premedicated before session;Monitored during session;Repositioned;Ice applied    Home Living                      Prior Function            PT Goals (current goals can now be found in the care plan section) Progress towards PT goals: PT to reassess next treatment    Frequency    BID      PT Plan Current plan remains appropriate    Co-evaluation              AM-PAC PT "6 Clicks" Mobility   Outcome Measure  Help needed  turning from your back to your side while in a flat bed without using bedrails?: A Little Help needed moving from lying on your back to sitting on the side of a flat bed without using bedrails?: A Little Help needed moving to and from a bed to a chair (including a wheelchair)?: A Little Help needed standing up from a chair using your arms (e.g., wheelchair or bedside chair)?: A Little Help needed to walk in hospital room?: A Lot Help needed climbing 3-5 steps with a railing? : A Lot 6 Click Score: 16    End of Session Equipment Utilized During Treatment: Gait belt Activity Tolerance: Other (comment) (limited by nausea and to a lesser extent dizziness) Patient left: in chair;with call bell/phone within reach;with chair alarm set;with SCD's reapplied;Other (comment);with family/visitor present (Polar care donned to R knee) Nurse Communication: Mobility status;Weight bearing status PT Visit Diagnosis: Other abnormalities of gait and mobility (R26.89);Difficulty in walking, not elsewhere classified (R26.2);Pain;Muscle weakness (generalized) (M62.81) Pain - Right/Left: Right Pain - part of body: Knee     Time: 0938-1829 PT Time Calculation (min) (ACUTE ONLY): 42 min  Charges:  $Therapeutic Exercise: 23-37 mins $Therapeutic Activity: 8-22 mins                     D. Scott Shauntee Karp PT, DPT 03/24/21, 11:37 AM

## 2021-03-24 NOTE — Progress Notes (Signed)
Physical Therapy Treatment Patient Details Name: Breanna Hanson MRN: [id number] DOB: 12/05/1950 Today's Date: 03/24/2021    History of Present Illness Pt is a 70 yo female s/p R TKA, WBAT. PMH of GERD,HTN, HLD, L THA.    PT Comments    Pt was pleasant and motivated to participate during the session and made very good progress towards goals. Pt reported no nausea this session and 4/10 pain in her R knee.  Pt was able to demonstrate good eccentric and concentric control and stability during transfer training from various surfaces and was able to amb 100 feet with slow, step-to pattern but was steady without LOB or buckling.  Pt was fatigued after ambulation with stair training deferred until following session.  Pt will benefit from HHPT upon discharge to safely address deficits listed in patient problem list for decreased caregiver assistance and eventual return to PLOF.        Follow Up Recommendations  Home health PT;Supervision/Assistance - 24 hour     Equipment Recommendations  Rolling walker with 5" wheels;3in1 (PT)    Recommendations for Other Services       Precautions / Restrictions Precautions Precautions: Fall;Knee Precaution Booklet Issued: Yes (comment) Restrictions Weight Bearing Restrictions: Yes RLE Weight Bearing: Weight bearing as tolerated    Mobility  Bed Mobility               General bed mobility comments: NT, pt in recliner    Transfers Overall transfer level: Needs assistance Equipment used: Rolling walker (2 wheeled) Transfers: Sit to/from Stand Sit to Stand: Min guard         General transfer comment: Min verbal and visual cues for sequencing for hand and R foot placement  Ambulation/Gait Ambulation/Gait assistance: Min guard Gait Distance (Feet): 100 Feet  X  1, 15 Feet x 2 Assistive device: Rolling walker (2 wheeled) Gait Pattern/deviations: Step-to pattern;Decreased stance time - right;Decreased step length - left Gait velocity:  decreased   General Gait Details: Slow, step-to cadence but steady without LOB with cues for sequencing and for upright posture   Stairs             Wheelchair Mobility    Modified Rankin (Stroke Patients Only)       Balance Overall balance assessment: Needs assistance Sitting-balance support: Bilateral upper extremity supported Sitting balance-Leahy Scale: Good     Standing balance support: Bilateral upper extremity supported;During functional activity Standing balance-Leahy Scale: Fair Standing balance comment: reaching outside BOS                            Cognition Arousal/Alertness: Awake/alert Behavior During Therapy: WFL for tasks assessed/performed Overall Cognitive Status: Within Functional Limits for tasks assessed                                        Exercises Total Joint Exercises Quad Sets: AROM;Strengthening;Right;10 reps Long Arc Quad: AROM;Strengthening;Both;10 reps;15 reps Knee Flexion: AROM;Strengthening;Both;10 reps;15 reps Other Exercises Other Exercises: 90 deg R turn training to prevent CKC twisting on the R knee Other Exercises: Positioning review with pt and spouse to promote R knee ext PROM Other Exercises: Car transfer sequencing review with pt and spouse verbally and with visual simulation    General Comments        Pertinent Vitals/Pain Pain Assessment: No/denies pain Pain Score: 4  Pain  Location: R knee Pain Descriptors / Indicators: Aching;Sore Pain Intervention(s): Premedicated before session;Monitored during session;Repositioned;Ice applied    Home Living Family/patient expects to be discharged to:: Private residence Living Arrangements: Spouse/significant other Available Help at Discharge: Family;Available 24 hours/day Type of Home: House Home Access: Stairs to enter Entrance Stairs-Rails: None Home Layout: One level Home Equipment: Environmental consultant - 2 wheels;Shower seat - built in;Cane - single  point      Prior Function Level of Independence: Independent          PT Goals (current goals can now be found in the care plan section) Acute Rehab PT Goals Patient Stated Goal: to feel better Progress towards PT goals: Progressing toward goals    Frequency    BID      PT Plan Current plan remains appropriate    Co-evaluation              AM-PAC PT "6 Clicks" Mobility   Outcome Measure  Help needed turning from your back to your side while in a flat bed without using bedrails?: A Little Help needed moving from lying on your back to sitting on the side of a flat bed without using bedrails?: A Little Help needed moving to and from a bed to a chair (including a wheelchair)?: A Little Help needed standing up from a chair using your arms (e.g., wheelchair or bedside chair)?: A Little Help needed to walk in hospital room?: A Little Help needed climbing 3-5 steps with a railing? : A Little 6 Click Score: 18    End of Session Equipment Utilized During Treatment: Gait belt Activity Tolerance: Patient tolerated treatment well Patient left: in chair;with call bell/phone within reach;with chair alarm set;with SCD's reapplied;Other (comment);with family/visitor present (polar care donned to R knee) Nurse Communication: Mobility status;Weight bearing status PT Visit Diagnosis: Other abnormalities of gait and mobility (R26.89);Difficulty in walking, not elsewhere classified (R26.2);Pain;Muscle weakness (generalized) (M62.81) Pain - Right/Left: Right Pain - part of body: Knee     Time: 1856-3149 PT Time Calculation (min) (ACUTE ONLY): 44 min  Charges:  $Gait Training: 23-37 mins $Therapeutic Activity: 8-22 mins                     D. Scott Irene Mitcham PT, DPT 03/24/21, 3:40 PM

## 2021-03-25 MED ORDER — TRAMADOL HCL 50 MG PO TABS: 50.0000 mg | ORAL_TABLET | Freq: Four times a day (QID) | ORAL | 0 refills | Status: DC | PRN

## 2021-03-25 MED ORDER — ENOXAPARIN SODIUM 40 MG/0.4ML IJ SOSY: 40.0000 mg | PREFILLED_SYRINGE | INTRAMUSCULAR | 0 refills | Status: DC

## 2021-03-25 MED ORDER — ONDANSETRON HCL 4 MG PO TABS: 4.0000 mg | ORAL_TABLET | Freq: Four times a day (QID) | ORAL | 0 refills | Status: DC | PRN

## 2021-03-25 MED ORDER — METHOCARBAMOL 500 MG PO TABS: 500.0000 mg | ORAL_TABLET | Freq: Four times a day (QID) | ORAL | 0 refills | Status: DC | PRN

## 2021-03-25 MED ORDER — HYDROCODONE-ACETAMINOPHEN 5-325 MG PO TABS: 1.0000 | ORAL_TABLET | ORAL | 0 refills | Status: AC | PRN

## 2021-03-25 MED ORDER — DOCUSATE SODIUM 100 MG PO CAPS: 100.0000 mg | ORAL_CAPSULE | Freq: Two times a day (BID) | ORAL | 0 refills | Status: DC

## 2021-03-25 NOTE — Progress Notes (Signed)
Physical Therapy Treatment Patient Details Name: Breanna Hanson MRN: [id number] DOB: 1950/12/31 Today's Date: 03/25/2021    History of Present Illness Pt is a 70 yo female s/p R TKA, WBAT. PMH of GERD,HTN, HLD, L THA.    PT Comments    Pt was pleasant and motivated to participate during the session and continued to make good progress towards goals.  Pt ambulated with mostly step-to pattern but with occasional short bouts of step-through pattern with grossly improved cadence and RLE stance time.  Pt steady with amb with no R knee buckling with cues for upright posture and amb closer to the RW as well as for attempts at increased cadence and LLE step length. Pt was steady ascending and descending stairs per below with good carryover of proper sequencing and no reaching for the rail for support this session.  Pt will benefit from HHPT upon discharge to safely address deficits listed in patient problem list for decreased caregiver assistance and eventual return to PLOF.   Follow Up Recommendations  Home health PT;Supervision/Assistance - 24 hour     Equipment Recommendations  Rolling walker with 5" wheels;3in1 (PT)    Recommendations for Other Services       Precautions / Restrictions Precautions Precautions: Fall;Knee Restrictions Weight Bearing Restrictions: Yes RLE Weight Bearing: Weight bearing as tolerated    Mobility  Bed Mobility               General bed mobility comments: NT, pt in recliner    Transfers Overall transfer level: Needs assistance Equipment used: Rolling walker (2 wheeled) Transfers: Sit to/from Stand Sit to Stand: Supervision         General transfer comment: Fair eccentric and concentric control and stability  Ambulation/Gait Ambulation/Gait assistance: Min guard Gait Distance (Feet): 100 Feet Assistive device: Rolling walker (2 wheeled) Gait Pattern/deviations: Step-to pattern;Decreased stance time - right;Decreased step length -  left;Step-through pattern Gait velocity: decreased   General Gait Details: Mostly step-to pattern with occasional short bouts of step-through pattern with grossly improved cadence and RLE stance time.  Pt steady with amb with no R knee buckling with cues for upright posture and amb closer to the RW as well as for attempts at increased cadence and LLE step length.   Stairs Stairs: Yes Stairs assistance: Min guard Stair Management: Backwards;Forwards;Step to pattern;With walker Number of Stairs: 4 General stair comments: Stair training with pt and spouse 4 steps x 1 and then one box step x 1 ascending backwards and descending forwards with good stability and carryover of proper sequencing; pt with no instances of reaching for the railing for support this session.   Wheelchair Mobility    Modified Rankin (Stroke Patients Only)       Balance Overall balance assessment: Needs assistance Sitting-balance support: Bilateral upper extremity supported Sitting balance-Leahy Scale: Good     Standing balance support: Bilateral upper extremity supported;During functional activity Standing balance-Leahy Scale: Fair                              Cognition Arousal/Alertness: Awake/alert Behavior During Therapy: WFL for tasks assessed/performed Overall Cognitive Status: Within Functional Limits for tasks assessed                                        Exercises Total Joint Exercises Quad Sets: AROM;Strengthening;Right;10 reps;5 reps  Long Arc Quad: AROM;Strengthening;Both;10 reps;15 reps Knee Flexion: AROM;Strengthening;Both;10 reps;15 reps    General Comments        Pertinent Vitals/Pain Pain Assessment: 0-10 Pain Score: 2  Pain Location: R knee Pain Descriptors / Indicators: Aching;Sore Pain Intervention(s): Premedicated before session;Monitored during session    Home Living                      Prior Function            PT Goals  (current goals can now be found in the care plan section) Progress towards PT goals: Progressing toward goals    Frequency    BID      PT Plan Current plan remains appropriate    Co-evaluation              AM-PAC PT "6 Clicks" Mobility   Outcome Measure  Help needed turning from your back to your side while in a flat bed without using bedrails?: A Little Help needed moving from lying on your back to sitting on the side of a flat bed without using bedrails?: A Little Help needed moving to and from a bed to a chair (including a wheelchair)?: A Little Help needed standing up from a chair using your arms (e.g., wheelchair or bedside chair)?: A Little Help needed to walk in hospital room?: A Little Help needed climbing 3-5 steps with a railing? : A Little 6 Click Score: 18    End of Session Equipment Utilized During Treatment: Gait belt Activity Tolerance: Patient tolerated treatment well Patient left: in chair;with call bell/phone within reach;with chair alarm set;with SCD's reapplied;Other (comment);with family/visitor present (Polar care to R knee) Nurse Communication: Mobility status;Weight bearing status PT Visit Diagnosis: Other abnormalities of gait and mobility (R26.89);Difficulty in walking, not elsewhere classified (R26.2);Pain;Muscle weakness (generalized) (M62.81) Pain - Right/Left: Right Pain - part of body: Knee     Time: 2440-1027 PT Time Calculation (min) (ACUTE ONLY): 26 min  Charges:  $Gait Training: 23-37 mins                     D. Scott Dyson Sevey PT, DPT 03/25/21, 2:51 PM

## 2021-03-25 NOTE — Discharge Summary (Signed)
Physician Discharge Summary  Patient ID: Breanna Hanson MRN: [id number] DOB/AGE: 1951/02/27 70 y.o.  Admit date: 03/23/2021 Discharge date: 03/25/2021  Admission Diagnoses:  S/P TKR (total knee replacement) using cement, right [Z96.651]   Discharge Diagnoses: Patient Active Problem List   Diagnosis Date Noted  . S/P TKR (total knee replacement) using cement, right 03/23/2021  . Status post total hip replacement, left 06/12/2018    Past Medical History:  Diagnosis Date  . Arthritis   . Elevated lipids   . GERD (gastroesophageal reflux disease)   . Hypertension   . Hypothyroidism   . PONV (postoperative nausea and vomiting)   . Pulmonary nodule      Transfusion: none   Consultants (if any):   Discharged Condition: Improved  Hospital Course: Breanna Hanson is an 70 y.o. female who was admitted 03/23/2021 with a diagnosis of Right knee osteoarthritis and went to the operating room on 03/23/2021 and underwent the above named procedures.    Surgeries: Procedure(s): TOTAL KNEE ARTHROPLASTY - [place] Neighbor to Assist on 03/23/2021 Patient tolerated the surgery well. Taken to PACU where she was stabilized and then transferred to the orthopedic floor.  Started on Lovenox 30 mg q 12 hrs. Foot pumps applied bilaterally at 80 mm. Heels elevated on bed with rolled towels. No evidence of DVT. Negative Homan. Physical therapy started on day #1 for gait training and transfer. OT started day #1 for ADL and assisted devices.  Patient's foley was d/c on day #1. Patient's IV was d/c on day #2.  On post op day #2 patient was stable and ready for discharge to home with HHPT.  Implants: Medacta GMK sphere system with 3 rightfemur, 2 righttibia with short stem and 97mm insert. Size2patella, all components cemented.   She was given perioperative antibiotics:  Anti-infectives (From admission, onward)   Start     Dose/Rate Route Frequency Ordered Stop   03/23/21 1400  ceFAZolin (ANCEF) IVPB  2g/100 mL premix        2 g 200 mL/hr over 30 Minutes Intravenous Every 6 hours 03/23/21 1045 03/23/21 2206   03/23/21 0610  ceFAZolin (ANCEF) 2-4 GM/100ML-% IVPB       Note to Pharmacy: Desma Paganini   : cabinet override      03/23/21 0610 03/23/21 0755   03/23/21 0600  ceFAZolin (ANCEF) IVPB 2g/100 mL premix        2 g 200 mL/hr over 30 Minutes Intravenous On call to O.R. 03/23/21 0217 03/23/21 0745    .  She was given sequential compression devices, early ambulation, and Lovenox TEDs for DVT prophylaxis.  She benefited maximally from the hospital stay and there were no complications.    Recent vital signs:  Vitals:   03/25/21 0438 03/25/21 0839  BP: 129/64 123/72  Pulse: 71 70  Resp: 18 16  Temp: 97.7 F (36.5 C) 98.2 F (36.8 C)  SpO2: 97% 98%    Recent laboratory studies:  Lab Results  Component Value Date   HGB 11.7 (L) 03/24/2021   HGB 12.1 03/23/2021   HGB 13.5 02/23/2021   Lab Results  Component Value Date   WBC 9.9 03/24/2021   PLT 193 03/24/2021   Lab Results  Component Value Date   INR 0.94 05/30/2018   Lab Results  Component Value Date   NA 136 03/24/2021   K 4.7 03/24/2021   CL 102 03/24/2021   CO2 26 03/24/2021   BUN 17 03/24/2021   CREATININE 1.21 (H) 03/24/2021  GLUCOSE 132 (H) 03/24/2021    Discharge Medications:   Allergies as of 03/25/2021   No Known Allergies     Medication List    STOP taking these medications   acetaminophen 650 MG CR tablet Commonly known as: TYLENOL   ibuprofen 200 MG tablet Commonly known as: ADVIL     TAKE these medications   amoxicillin 500 MG capsule Commonly known as: AMOXIL Take 500 mg by mouth 4 (four) times daily.   CALCIUM 600/VITAMIN D3 PO Take 1 tablet by mouth daily.   cyanocobalamin 1000 MCG tablet Take 1,000 mcg by mouth daily.   diphenhydrAMINE 25 MG tablet Commonly known as: BENADRYL Take 25 mg by mouth daily as needed for allergies.   docusate sodium 100 MG capsule Commonly  known as: COLACE Take 1 capsule (100 mg total) by mouth 2 (two) times daily.   enoxaparin 40 MG/0.4ML injection Commonly known as: LOVENOX Inject 0.4 mLs (40 mg total) into the skin daily for 14 days.   hydrochlorothiazide 25 MG tablet Commonly known as: HYDRODIURIL Take 25 mg by mouth daily.   HYDROcodone-acetaminophen 5-325 MG tablet Commonly known as: NORCO/VICODIN Take 1 tablet by mouth every 4 (four) hours as needed for moderate pain (pain score 4-6).   levothyroxine 112 MCG tablet Commonly known as: SYNTHROID Take 112 mcg by mouth daily before breakfast.   lisinopril 20 MG tablet Commonly known as: ZESTRIL Take 20 mg by mouth daily.   MegaRed Omega-3 Krill Oil 500 MG Caps Take 500 mg by mouth daily.   methocarbamol 500 MG tablet Commonly known as: ROBAXIN Take 1 tablet (500 mg total) by mouth every 6 (six) hours as needed for muscle spasms.   multivitamin with minerals tablet Take 1 tablet by mouth daily.   ondansetron 4 MG tablet Commonly known as: ZOFRAN Take 1 tablet (4 mg total) by mouth every 6 (six) hours as needed for nausea.   simvastatin 40 MG tablet Commonly known as: ZOCOR Take 40 mg by mouth every evening.   traMADol 50 MG tablet Commonly known as: ULTRAM Take 1 tablet (50 mg total) by mouth every 6 (six) hours as needed.   vitamin C 1000 MG tablet Take 1,000 mg by mouth daily.            Durable Medical Equipment  (From admission, onward)         Start     Ordered   03/23/21 1046  DME Walker rolling  Once       Question Answer Comment  Walker: With 5 Inch Wheels   Patient needs a walker to treat with the following condition S/P TKR (total knee replacement) using cement, right      03/23/21 1045   03/23/21 1046  DME 3 n 1  Once        03/23/21 1045   03/23/21 1046  DME Bedside commode  Once       Question:  Patient needs a bedside commode to treat with the following condition  Answer:  S/P TKR (total knee replacement) using cement,  right   03/23/21 1045          Diagnostic Studies: DG Knee 1-2 Views Right  Result Date: 03/23/2021 CLINICAL DATA:  Postop right knee. EXAM: RIGHT KNEE - 1-2 VIEW COMPARISON:  None. FINDINGS: Two views demonstrate a total knee arthroplasty. Knee is located without a periprosthetic fracture. Lucency in the soft tissues compatible with recent surgery. Anterior skin staples. Evidence for a wound VAC along the  anterior aspect of the knee. IMPRESSION: Right knee arthroplasty without complicating features. Electronically Signed   By: Richarda Overlie M.D.   On: 03/23/2021 10:09   DG Chest Port 1 View  Result Date: 03/23/2021 CLINICAL DATA:  Chest pain EXAM: PORTABLE CHEST 1 VIEW COMPARISON:  None. FINDINGS: There is mild interstitial thickening without edema or airspace opacity. Heart size and pulmonary vascularity are normal. No adenopathy. There is mid to lower thoracic levoscoliosis with degenerative change. No pneumothorax. IMPRESSION: Mild interstitial thickening, likely indicative of a degree of underlying chronic bronchitis. No edema or airspace opacity. Heart size normal. Electronically Signed   By: Bretta Bang III M.D.   On: 03/23/2021 10:31    Disposition:      Follow-up Information    Evon Slack, PA-C Follow up in 2 week(s).   Specialties: Orthopedic Surgery, Emergency Medicine Contact information: [address] [place] [address] [phone]                Signed: Patience Musca 03/25/2021, 10:21 AM

## 2021-03-25 NOTE — Progress Notes (Signed)
Discharge Note: Reviewed discharge instructions with pt. Pt and spouse verbalized understanding.  Bilateral compression stockings on. Pt d/ced with Incentive spirometer, extra honeycomb dressing, polar care, and personal belongings. Per Clinical social worker, pt has already been set up with home health. Obtained vitals. IV cath intact upon removal. Pt  Transported to home via family vehicle.

## 2021-03-25 NOTE — Progress Notes (Signed)
   Subjective: 2 Days Post-Op Procedure(s) (LRB): TOTAL KNEE ARTHROPLASTY - [place] Neighbor to Assist (Right) Patient reports pain as moderate.   Patient is well, and has had no acute complaints or problems. No N/V. Denies any CP, SOB, ABD pain. We will continue therapy today.  Plan is to go Home after hospital stay.  Objective: Vital signs in last 24 hours: Temp:  [97.5 F (36.4 C)-98.2 F (36.8 C)] 98.2 F (36.8 C) (06/02 0839) Pulse Rate:  [58-89] 70 (06/02 0839) Resp:  [16-18] 16 (06/02 0839) BP: (116-137)/(57-72) 123/72 (06/02 0839) SpO2:  [95 %-100 %] 98 % (06/02 0839)  Intake/Output from previous day: 06/01 0701 - 06/02 0700 In: 488.4 [I.V.:488.4] Out: -  Intake/Output this shift: No intake/output data recorded.  Recent Labs    03/23/21 1245 03/24/21 0450  HGB 12.1 11.7*   Recent Labs    03/23/21 1245 03/24/21 0450  WBC 8.9 9.9  RBC 3.99 3.81*  HCT 35.3* 33.4*  PLT 182 193   Recent Labs    03/23/21 1245 03/24/21 0450  NA  --  136  K  --  4.7  CL  --  102  CO2  --  26  BUN  --  17  CREATININE 1.23* 1.21*  GLUCOSE  --  132*  CALCIUM  --  9.1   No results for input(s): LABPT, INR in the last 72 hours.  EXAM General - Patient is Alert, Appropriate and Oriented Extremity - Neurovascular intact Sensation intact distally Intact pulses distally Dorsiflexion/Plantar flexion intact Dressing - dressing C/D/I and no drainage, prevena intact with out drainage Motor Function - intact, moving foot and toes well on exam.   Past Medical History:  Diagnosis Date  . Arthritis   . Elevated lipids   . GERD (gastroesophageal reflux disease)   . Hypertension   . Hypothyroidism   . PONV (postoperative nausea and vomiting)   . Pulmonary nodule     Assessment/Plan:   2 Days Post-Op Procedure(s) (LRB): TOTAL KNEE ARTHROPLASTY - [place] Neighbor to Assist (Right) Active Problems:   S/P TKR (total knee replacement) using cement, right  Estimated body mass index  is 30.91 kg/m as calculated from the following:   Height as of this encounter: 5\' 2"  (1.575 m).   Weight as of this encounter: 76.7 kg. Advance diet Up with therapy  Work on and VSS Nausea - resolved CM to assist with discharge to home with HHPT today  DVT Prophylaxis - Lovenox, TED hose and SCDs Weight-Bearing as tolerated to right leg   T. [employer], PA-C [facility] 03/25/2021, 10:16 AM

## 2021-03-25 NOTE — Progress Notes (Signed)
Physical Therapy Treatment Patient Details Name: Breanna Hanson MRN: [id number] DOB: 1951-10-13 Today's Date: 03/25/2021    History of Present Illness Pt is a 70 yo female s/p R TKA, WBAT. PMH of GERD,HTN, HLD, L THA.    PT Comments    Pt was pleasant and motivated to participate during the session. Pt able to amb 125 feet with min to mod verbal cues for upright posture, positioning relative to the walker, decreased UE WB on the walker, and initiating step-through pattern; pt steady without LOB with SpO2 and HR WNL on room air. Pt and spouse training provided on ascending stairs backwards and descending forwards with a RW with pt generally steady with cues for proper sequencing.  Pt occasionally reached out for rail to steady herself while advancing the RW and recommendation made to pt and spouse for +2 assist at home with stair negotiation, pt and spouse understood and agreed.  Pt will benefit from HHPT upon discharge to safely address deficits listed in patient problem list for decreased caregiver assistance and eventual return to PLOF.     Follow Up Recommendations  Home health PT;Supervision/Assistance - 24 hour     Equipment Recommendations  Rolling walker with 5" wheels;3in1 (PT)    Recommendations for Other Services       Precautions / Restrictions Precautions Precautions: Fall;Knee Restrictions Weight Bearing Restrictions: Yes RLE Weight Bearing: Weight bearing as tolerated    Mobility  Bed Mobility               General bed mobility comments: NT, pt in recliner    Transfers Overall transfer level: Needs assistance Equipment used: Rolling walker (2 wheeled) Transfers: Sit to/from Stand Sit to Stand: Supervision         General transfer comment: Fair eccentric and concentric control and stability  Ambulation/Gait Ambulation/Gait assistance: Min guard Gait Distance (Feet): 125 Feet x 1, 40 Feet x 1 Assistive device: Rolling walker (2 wheeled) Gait  Pattern/deviations: Step-to pattern;Decreased stance time - right;Decreased step length - left;Step-through pattern Gait velocity: decreased   General Gait Details: Min to mod verbal cues for upright posture, positioning relative to the walker, decreased UE WB on the walker, and initiating step-through pattern; pt steady without LOB with SpO2 and HR WNL on room air   Stairs Stairs: Yes Stairs assistance: Min guard Stair Management: Backwards;Forwards;Step to pattern;With walker Number of Stairs: 4 x 2 General stair comments: 4 steps x 2 with RW and mod verbal and visual cues for sequencing; pt spouse present for training and proper guarding technique with recommendation made to pt and spouse for +2 assist at home wtih stairs   Wheelchair Mobility    Modified Rankin (Stroke Patients Only)       Balance Overall balance assessment: Needs assistance Sitting-balance support: Bilateral upper extremity supported Sitting balance-Leahy Scale: Good     Standing balance support: Bilateral upper extremity supported;During functional activity Standing balance-Leahy Scale: Fair                              Cognition Arousal/Alertness: Awake/alert Behavior During Therapy: WFL for tasks assessed/performed Overall Cognitive Status: Within Functional Limits for tasks assessed                                        Exercises Total Joint Exercises Quad Sets: AROM;Strengthening;Right;10 reps;5 reps  Long Arc Quad: AROM;Strengthening;Both;10 reps;15 reps Knee Flexion: AROM;Strengthening;Both;10 reps;15 reps Goniometric ROM: R knee AROM: 6-83 deg Other Exercises Other Exercises: 90 deg R turn training to prevent CKC twisting on the R knee Other Exercises: Positioning review with pt and spouse to promote R knee ext PROM    General Comments        Pertinent Vitals/Pain Pain Assessment: 0-10 Pain Score: 5  Pain Location: R knee Pain Descriptors / Indicators:  Aching;Sore Pain Intervention(s): Premedicated before session;Monitored during session;Repositioned;Ice applied    Home Living                      Prior Function            PT Goals (current goals can now be found in the care plan section) Progress towards PT goals: Progressing toward goals    Frequency    BID      PT Plan Current plan remains appropriate    Co-evaluation              AM-PAC PT "6 Clicks" Mobility   Outcome Measure  Help needed turning from your back to your side while in a flat bed without using bedrails?: A Little Help needed moving from lying on your back to sitting on the side of a flat bed without using bedrails?: A Little Help needed moving to and from a bed to a chair (including a wheelchair)?: A Little Help needed standing up from a chair using your arms (e.g., wheelchair or bedside chair)?: A Little Help needed to walk in hospital room?: A Little Help needed climbing 3-5 steps with a railing? : A Little 6 Click Score: 18    End of Session Equipment Utilized During Treatment: Gait belt Activity Tolerance: Patient tolerated treatment well Patient left: in chair;with call bell/phone within reach;with chair alarm set;with SCD's reapplied;Other (comment);with family/visitor present (Polar care to R knee) Nurse Communication: Mobility status;Weight bearing status PT Visit Diagnosis: Other abnormalities of gait and mobility (R26.89);Difficulty in walking, not elsewhere classified (R26.2);Pain;Muscle weakness (generalized) (M62.81) Pain - Right/Left: Right Pain - part of body: Knee     Time: 6712-4580 PT Time Calculation (min) (ACUTE ONLY): 46 min  Charges:  $Gait Training: 23-37 mins $Therapeutic Exercise: 8-22 mins                     D. Scott Teller [place] PT, DPT 03/25/21, 10:49 AM

## 2021-03-25 NOTE — Discharge Instructions (Signed)

## 2021-04-20 ENCOUNTER — Other Ambulatory Visit: Payer: Medicare HMO

## 2021-07-22 ENCOUNTER — Other Ambulatory Visit: Payer: Self-pay | Admitting: Internal Medicine

## 2021-07-22 DIAGNOSIS — N179 Acute kidney failure, unspecified: Secondary | ICD-10-CM

## 2021-07-27 ENCOUNTER — Other Ambulatory Visit: Payer: Self-pay | Admitting: Internal Medicine

## 2021-07-27 DIAGNOSIS — R7989 Other specified abnormal findings of blood chemistry: Secondary | ICD-10-CM

## 2021-07-27 DIAGNOSIS — I77810 Thoracic aortic ectasia: Secondary | ICD-10-CM

## 2021-07-27 DIAGNOSIS — R9389 Abnormal findings on diagnostic imaging of other specified body structures: Secondary | ICD-10-CM

## 2021-07-29 ENCOUNTER — Ambulatory Visit
Admission: RE | Admit: 2021-07-29 | Discharge: 2021-07-29 | Disposition: A | Discharge status: Home / Self Care | Payer: Medicare HMO | Source: Ambulatory Visit | Attending: Internal Medicine | Admitting: Internal Medicine

## 2021-07-29 ENCOUNTER — Other Ambulatory Visit: Payer: Self-pay

## 2021-07-29 DIAGNOSIS — N179 Acute kidney failure, unspecified: Secondary | ICD-10-CM

## 2021-08-05 ENCOUNTER — Ambulatory Visit (INDEPENDENT_AMBULATORY_CARE_PROVIDER_SITE_OTHER): Payer: Medicare HMO | Admitting: Urology

## 2021-08-05 ENCOUNTER — Other Ambulatory Visit: Payer: Self-pay

## 2021-08-05 VITALS — BP 138/81 | HR 89 | Ht 62.0 in | Wt 167.0 lb

## 2021-08-05 DIAGNOSIS — N1330 Unspecified hydronephrosis: Secondary | ICD-10-CM | POA: Diagnosis not present

## 2021-08-05 NOTE — Patient Instructions (Signed)
Hydronephrosis ?Hydronephrosis is the swelling of one or both kidneys due to a blockage that stops urine from flowing out of the body. Kidneys filter waste from the blood and produce urine. This condition can lead to kidney failure and may become life-threatening if not treated promptly. ?What are the causes? ?In infants and children, common causes include problems that occur when a baby is developing in the womb. These can include problems in the kidneys or in the tubes that drain urine into the bladder (ureters). ?In adults, common causes include: ?Kidney stones. ?Pregnancy. ?A tumor or cyst in the abdomen or pelvis. ?An enlarged prostate gland. ?Other causes include: ?Bladder infection. ?Scar tissue from a previous surgery or injury. ?A blood clot. ?Cancer of the prostate, bladder, uterus, ovary, or colon. ?What are the signs or symptoms? ?Symptoms of this condition include: ?Pain or discomfort in your side (flank) or abdomen. ?Swelling in your abdomen. ?Nausea and vomiting. ?Fever. ?Pain when passing urine. ?Feelings of urgency when you need to urinate. ?Urinating more often than normal. ?In some cases, you may not have any symptoms. ?How is this diagnosed? ?This condition may be diagnosed based on: ?Your symptoms and medical history. ?A physical exam. ?Blood and urine tests. ?Imaging tests, such as an ultrasound, CT scan, or MRI. ?A procedure to look at your urinary tract and bladder by inserting a scope into the urethra (cystoscopy). ?How is this treated? ?Treatment for this condition depends on where the blockage is, how long it has been there, and what caused it. The goal of treatment is to remove the blockage. Treatment may include: ?Antibiotic medicines to treat or prevent infection. ?A procedure to place a small, thin tube (stent) into a blocked ureter. The stent will keep the ureter open so that urine can drain through it. ?A nonsurgical procedure that crushes kidney stones with shock waves  (extracorporeal shock wave lithotripsy). ?If kidney failure occurs, treatment may include dialysis or a kidney transplant. ?Follow these instructions at home: ? ?Take over-the-counter and prescription medicines only as told by your health care provider. ?If you were prescribed an antibiotic medicine, take it exactly as told by your health care provider. Do not stop taking the antibiotic even if you start to feel better. ?Rest and return to your normal activities as told by your health care provider. Ask your health care provider what activities are safe for you. ?Drink enough fluid to keep your urine pale yellow. ?Keep all follow-up visits. This is important. ?Contact a health care provider if: ?You continue to have symptoms after treatment. ?You develop new symptoms. ?Your urine becomes cloudy or bloody. ?You have a fever. ?Get help right away if: ?You have severe flank or abdominal pain. ?You cannot drink fluids without vomiting. ?Summary ?Hydronephrosis is the swelling of one or both kidneys due to a blockage that stops urine from flowing out of the body. ?Hydronephrosis can lead to kidney failure and may become life-threatening if not treated promptly. ?The goal of treatment is to remove the blockage. It may include a procedure to insert a stent into a blocked ureter, a procedure to break up kidney stones, or taking antibiotic medicines. ?Follow your health care provider's instructions for taking care of yourself at home, including instructions about drinking fluids, taking medicines, and limiting activities. ?This information is not intended to replace advice given to you by your health care provider. Make sure you discuss any questions you have with your health care provider. ?Document Revised: 01/28/2020 Document Reviewed: 01/28/2020 ?Elsevier Patient   Education ? 2022 [employer]vier [employer]. ? ?

## 2021-08-05 NOTE — Progress Notes (Signed)
08/05/21 4:50 PM   Breanna Hanson 04-02-1951 [id number]  CC: Right hydronephrosis, AKI  HPI: Healthy 70 year old female who was found to have decreased GFR of 44 in May 2022, 48 on repeat, and 49 in June 2022.  Her baseline is greater than 60 with a creatinine of 0.84 in August 2019.  A renal ultrasound was performed for further evaluation on 07/29/2021 and showed severe right hydronephrosis with diffuse cortical atrophy, but no obvious etiology of obstruction.  There is no prior imaging to review.  She reports at least a few months of some mild right lower quadrant discomfort, but no severe pain.  She denies any Dietl's crises.  She denies any prior abdominal surgeries.  No gross hematuria.  Urinalysis was completely benign.  PMH: Past Medical History:  Diagnosis Date   Arthritis    Elevated lipids    GERD (gastroesophageal reflux disease)    Hypertension    Hypothyroidism    PONV (postoperative nausea and vomiting)    Pulmonary nodule     Surgical History: Past Surgical History:  Procedure Laterality Date   APPENDECTOMY     removed at time of c section   BREAST CYST ASPIRATION Right    CESAREAN SECTION     x 2   CHOLECYSTECTOMY     COLONOSCOPY WITH PROPOFOL N/A 08/14/2017   Procedure: COLONOSCOPY WITH PROPOFOL;  Surgeon: Christena Deem, MD;  Location: [facility] ENDOSCOPY;  Service: Endoscopy;  Laterality: N/A;   DILATION AND CURETTAGE OF UTERUS     FRACTURE SURGERY Right 01/2018   knee   JOINT REPLACEMENT     KNEE ARTHROSCOPY WITH SUBCHONDROPLASTY Right 03/22/2018   Procedure: KNEE ARTHROSCOPY WITH SUBCHONDROPLASTY WITH PARTIAL MEDIAL MENISECTOMY;  Surgeon: Kennedy Bucker, MD;  Location: [facility] ORS;  Service: Orthopedics;  Laterality: Right;   TOTAL HIP ARTHROPLASTY Left 06/12/2018   Procedure: TOTAL HIP ARTHROPLASTY ANTERIOR APPROACH;  Surgeon: Kennedy Bucker, MD;  Location: [facility] ORS;  Service: Orthopedics;  Laterality: Left;   TOTAL KNEE ARTHROPLASTY Right 03/23/2021    Procedure: TOTAL KNEE ARTHROPLASTY - [place] Neighbor to Assist;  Surgeon: Kennedy Bucker, MD;  Location: [facility] ORS;  Service: Orthopedics;  Laterality: Right;      Family History: Family History  Problem Relation Age of Onset   Breast cancer Neg Hx     Social History:  reports that she has never smoked. She has never used smokeless tobacco. She reports current alcohol use of about 1.0 standard drink per week. She reports that she does not use drugs.  Physical Exam: BP 138/81   Pulse 89   Ht 5\' 2"  (1.575 m)   Wt 167 lb (75.8 kg)   BMI 30.54 kg/m    Constitutional:  Alert and oriented, No acute distress. Cardiovascular: No clubbing, cyanosis, or edema. Respiratory: Normal respiratory effort, no increased work of breathing. GI: Abdomen is soft, nontender, nondistended, no abdominal masses   Laboratory Data: Reviewed, see HPI  Pertinent Imaging: I have personally viewed and interpreted the renal ultrasound showing severe right hydronephrosis and right renal atrophy.  Assessment & Plan:   70 year old female with right-sided hydronephrosis and AKI of unclear etiology since at least May 2022.  No prior imaging to review.  We discussed possible etiologies at length including nephrolithiasis, UPJ obstruction, mass/malignancy, or congenital.  We discussed treatment options vary greatly based on etiology and duration of obstruction, and potentially renal function which would require a nuc med renal scan.  CT abdomen pelvis without contrast next week, follow-up  to discuss results  Legrand Rams, MD 08/05/2021  [facility] [address] [place], [place] [address] [phone]

## 2021-08-10 ENCOUNTER — Other Ambulatory Visit: Payer: Self-pay

## 2021-08-10 ENCOUNTER — Ambulatory Visit
Admission: RE | Admit: 2021-08-10 | Discharge: 2021-08-10 | Disposition: A | Discharge status: Home / Self Care | Payer: Medicare HMO | Source: Ambulatory Visit | Attending: Urology | Admitting: Urology

## 2021-08-10 DIAGNOSIS — N1330 Unspecified hydronephrosis: Secondary | ICD-10-CM | POA: Diagnosis not present

## 2021-08-11 ENCOUNTER — Ambulatory Visit: Payer: Medicare HMO | Admitting: Urology

## 2021-08-11 ENCOUNTER — Encounter: Payer: Self-pay | Admitting: Urology

## 2021-08-11 VITALS — BP 157/76 | HR 87 | Ht 62.0 in | Wt 167.0 lb

## 2021-08-11 DIAGNOSIS — N135 Crossing vessel and stricture of ureter without hydronephrosis: Secondary | ICD-10-CM

## 2021-08-11 NOTE — Progress Notes (Signed)
   08/11/2021 11:29 AM   Breanna Hanson [birth year] [id number]  Reason for visit: Follow up right hydronephrosis  HPI: 70 year old female who was found to have decrease in her GFR to ~50 after previously being > 60 which prompted a renal ultrasound.  This showed severe right hydronephrosis.  We opted for a CT stone protocol for further evaluation.  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  We discussed that this is likely a congenital, or at least 20 to 30-year process resulting in a non-functional right kidney.  This may or may not be related to her recent change in renal function.  We discussed that stent placement or pyeloplasty would be very unlikely to improve her renal function, and she is essentially asymptomatic.  I recommended observation alone, and continuing follow-up with PCP versus nephrology for long-term monitoring of renal function.  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

## 2021-08-17 ENCOUNTER — Ambulatory Visit
Admission: RE | Admit: 2021-08-17 | Discharge: 2021-08-17 | Disposition: A | Discharge status: Home / Self Care | Payer: Medicare HMO | Source: Ambulatory Visit | Attending: Internal Medicine | Admitting: Internal Medicine

## 2021-08-17 ENCOUNTER — Other Ambulatory Visit: Payer: Self-pay

## 2021-08-17 DIAGNOSIS — R9389 Abnormal findings on diagnostic imaging of other specified body structures: Secondary | ICD-10-CM | POA: Diagnosis present

## 2021-08-17 DIAGNOSIS — I77810 Thoracic aortic ectasia: Secondary | ICD-10-CM | POA: Diagnosis not present

## 2021-08-17 DIAGNOSIS — R7989 Other specified abnormal findings of blood chemistry: Secondary | ICD-10-CM

## 2021-09-06 IMAGING — DX DG CHEST 1V PORT
1 series · 1 of 1 positions shown · non-contrast
Comparison: None.

CLINICAL DATA: Chest pain

EXAM:
PORTABLE CHEST 1 VIEW

[chest ap]
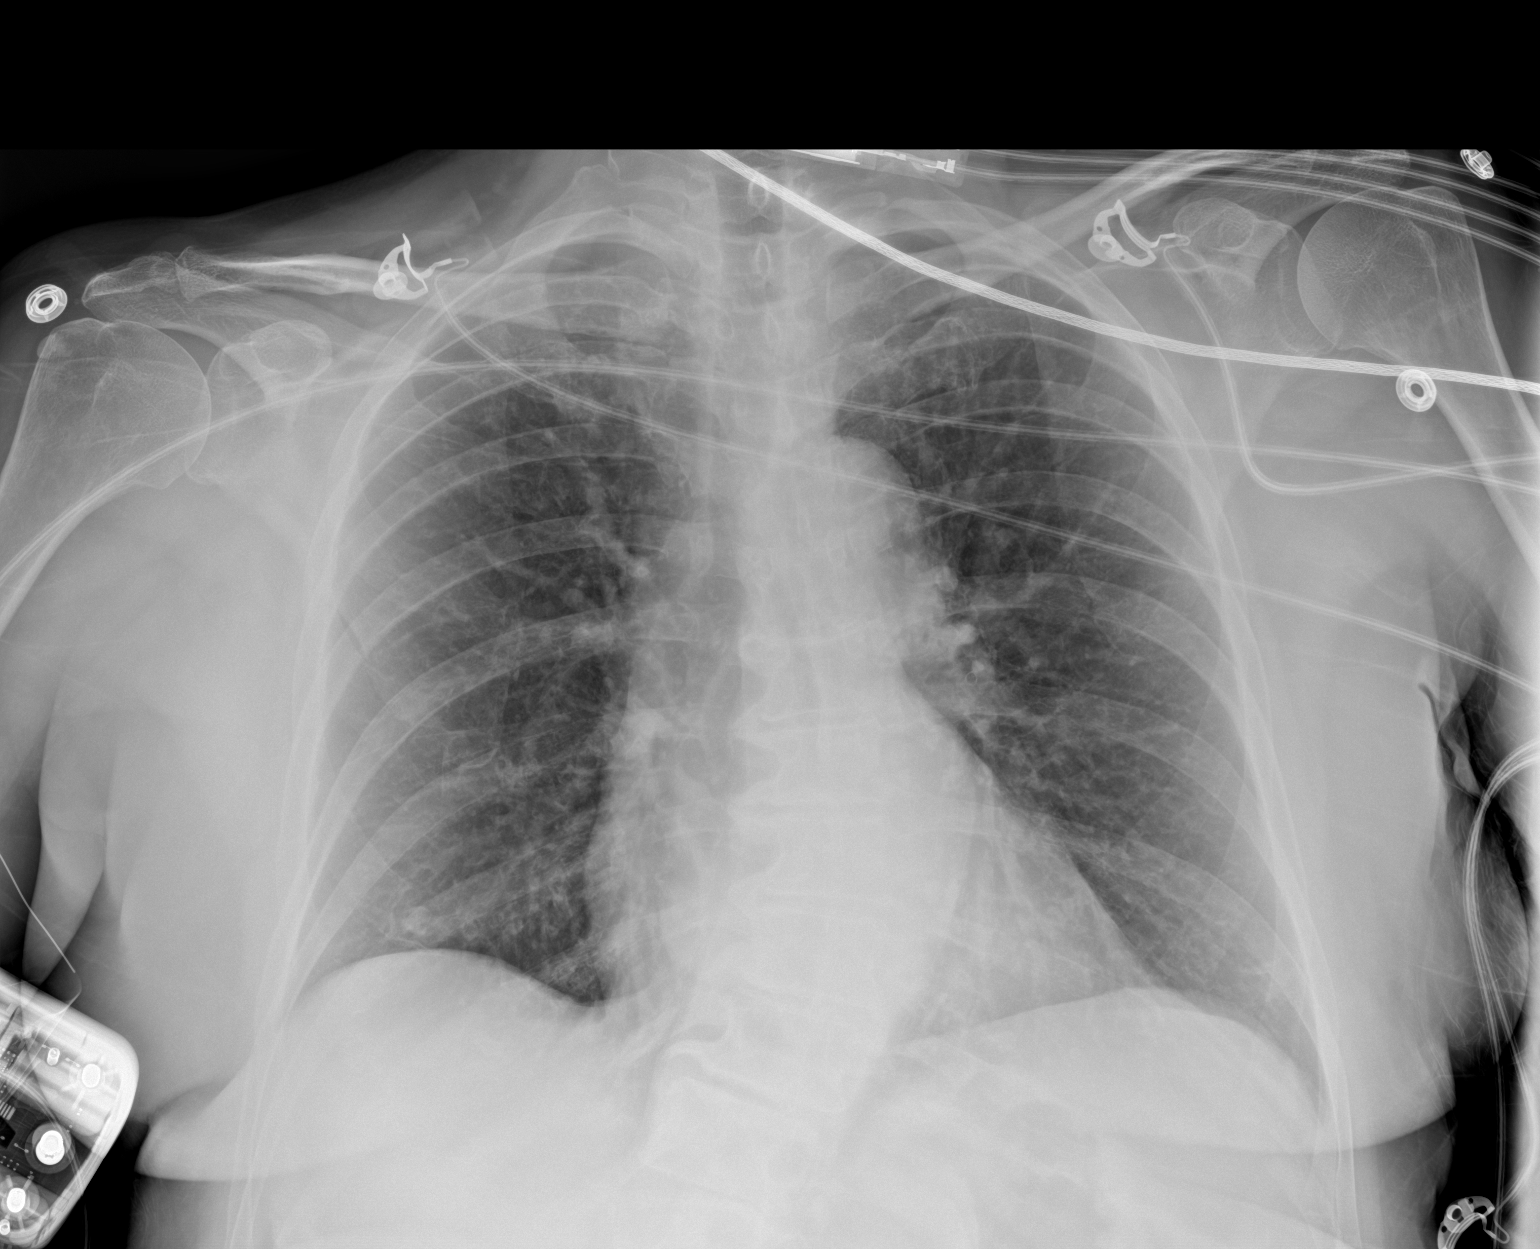

[1 of 1 positions shown; findings below may reference images not displayed]

FINDINGS: There is mild interstitial thickening without edema or airspace
opacity. Heart size and pulmonary vascularity are normal. No
adenopathy. There is mid to lower thoracic levoscoliosis with
degenerative change. No pneumothorax.
IMPRESSION: Mild interstitial thickening, likely indicative of a degree of
underlying chronic bronchitis. No edema or airspace opacity. Heart
size normal.

## 2021-11-10 ENCOUNTER — Other Ambulatory Visit: Payer: Self-pay | Admitting: Internal Medicine

## 2021-11-10 DIAGNOSIS — Z1231 Encounter for screening mammogram for malignant neoplasm of breast: Secondary | ICD-10-CM

## 2021-12-16 ENCOUNTER — Ambulatory Visit
Admission: RE | Admit: 2021-12-16 | Discharge: 2021-12-16 | Disposition: A | Discharge status: Home / Self Care | Payer: Medicare HMO | Source: Ambulatory Visit | Attending: Internal Medicine | Admitting: Internal Medicine

## 2021-12-16 ENCOUNTER — Other Ambulatory Visit: Payer: Self-pay

## 2021-12-16 DIAGNOSIS — Z1231 Encounter for screening mammogram for malignant neoplasm of breast: Secondary | ICD-10-CM | POA: Diagnosis present

## 2022-01-24 IMAGING — CT CT RENAL STONE PROTOCOL
2 of 4 series · 15 of 46 positions shown, 17 images · non-contrast
Comparison: Renal sonogram 07/29/2021

CLINICAL DATA: Hydronephrosis, progressive renal insufficiency

EXAM:
CT ABDOMEN AND PELVIS WITHOUT CONTRAST
TECHNIQUE: Multidetector CT imaging of the abdomen and pelvis was performed
following the standard protocol without IV contrast.

[Series 2: renal stone 5.00 · axial · 0.71mm/px · z∈[-1470,-1105]mm · 12 of 85 slices shown, 14 images]
[im 6/85  soft-tissue]
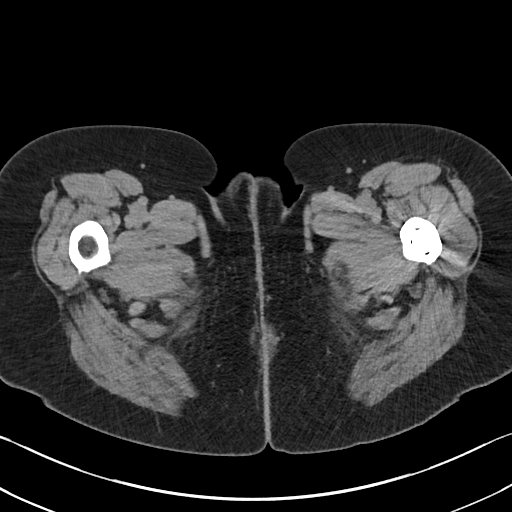
[im 6/85  bone]
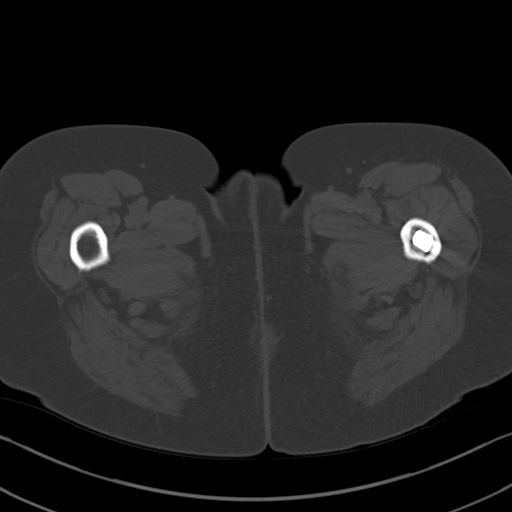
[im 12/85  soft-tissue]
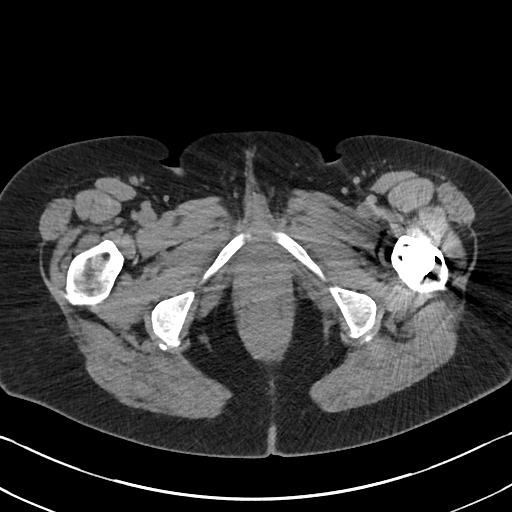
[im 17/85  soft-tissue]
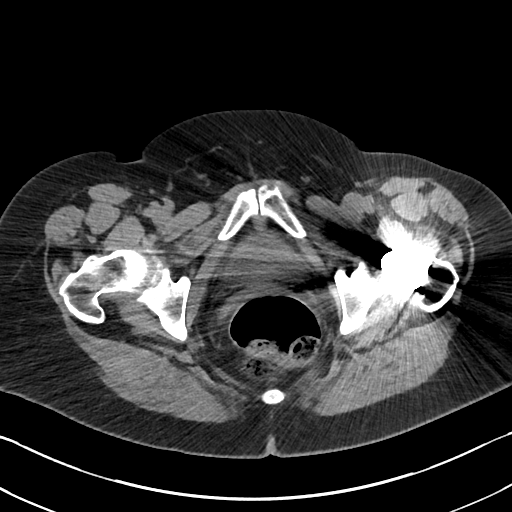
[im 29/85  soft-tissue]
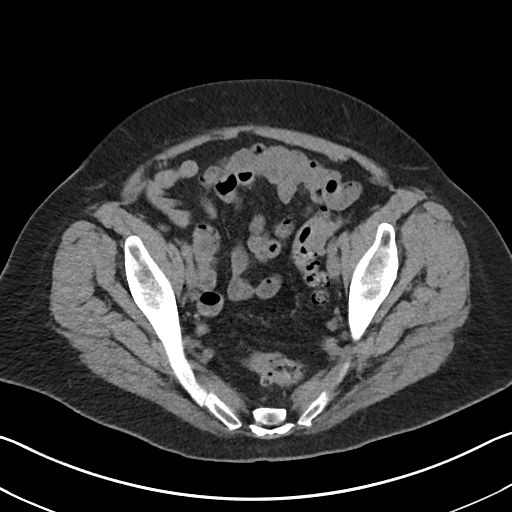
[im 34/85  soft-tissue]
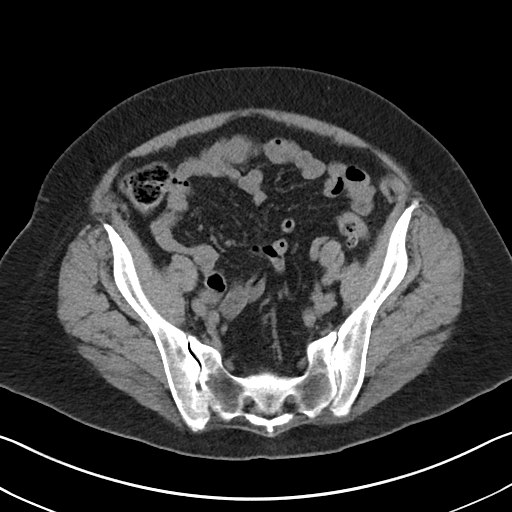
[im 40/85  soft-tissue]
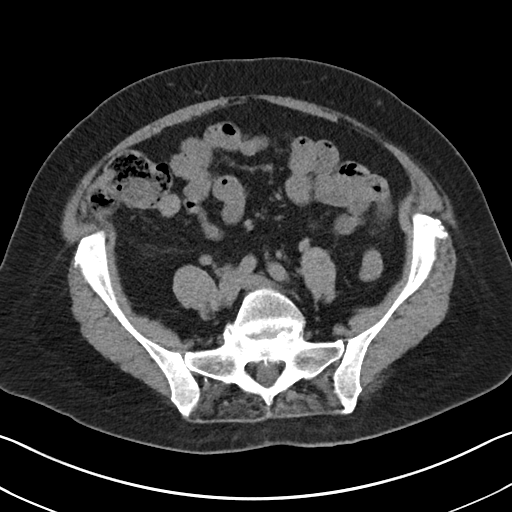
[im 45/85  soft-tissue]
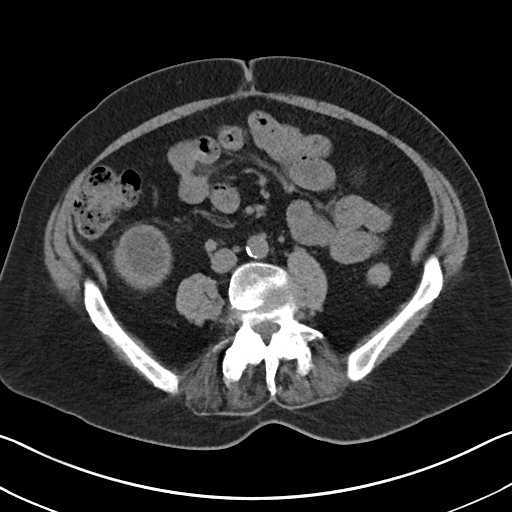
[im 51/85  soft-tissue]
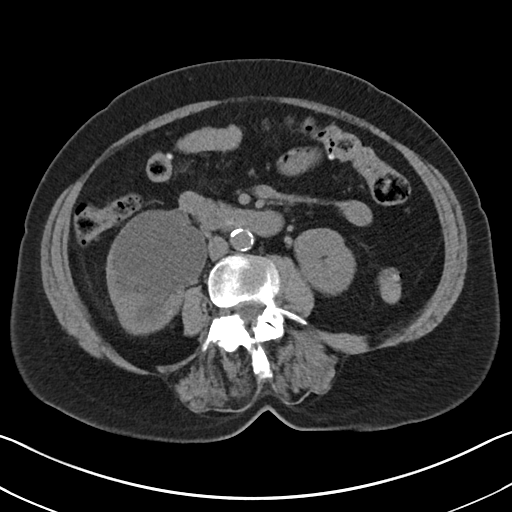
[im 57/85  soft-tissue]
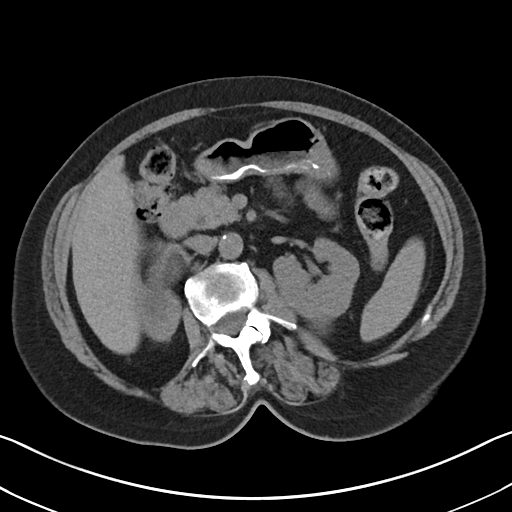
[im 57/85  bone]
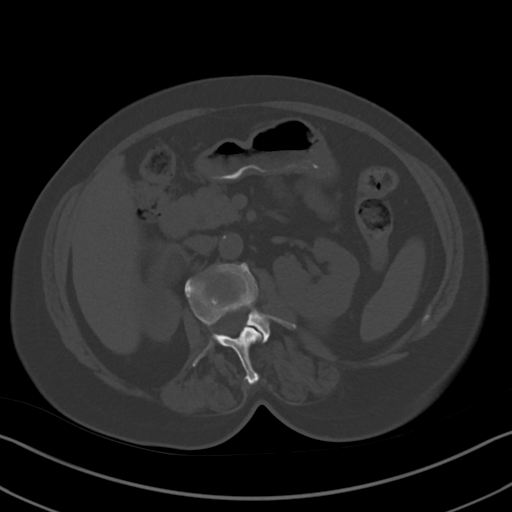
[im 68/85  soft-tissue]
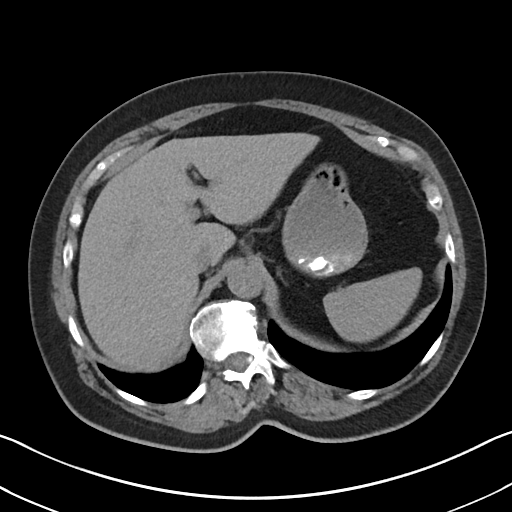
[im 73/85  soft-tissue]
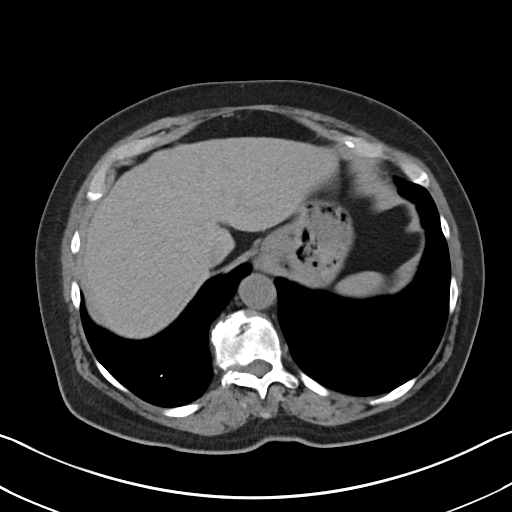
[im 79/85  soft-tissue]
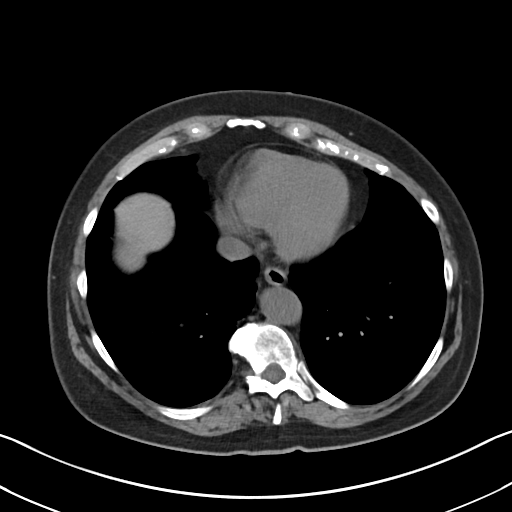

[Series 4: renal stone 2.00 cor · coronal · 0.71mm/px · 3 of 140 slices shown]
[im 47/140  soft-tissue]
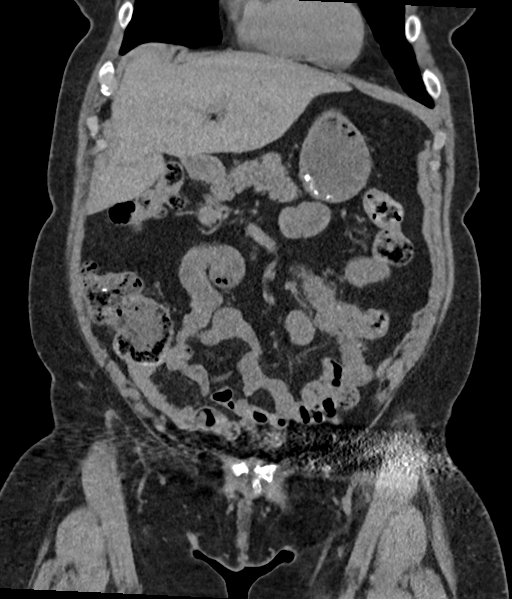
[im 62/140  soft-tissue]
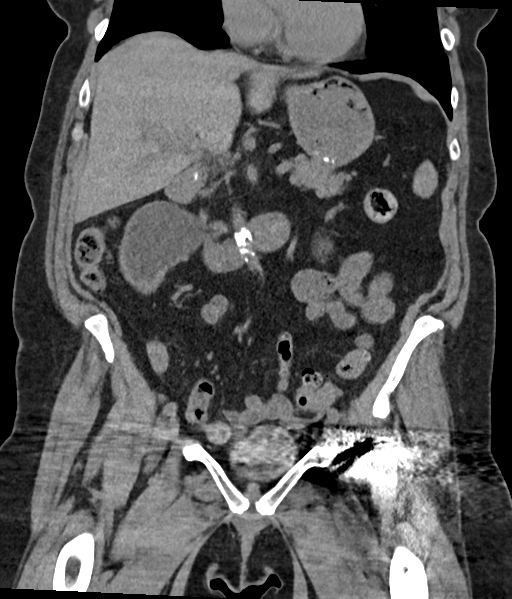
[im 78/140  soft-tissue]
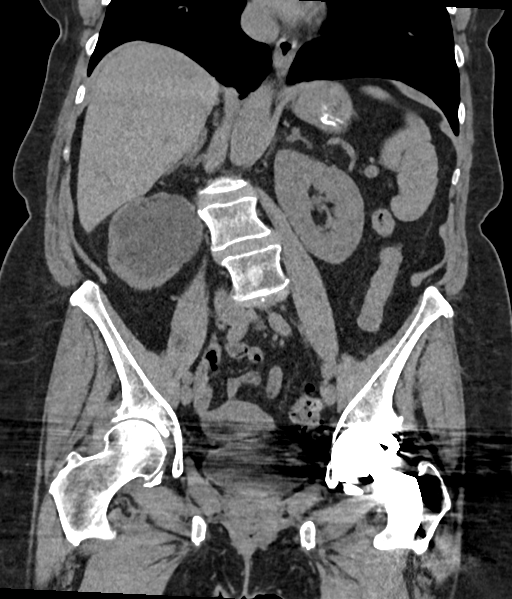

[15 of 46 positions shown; findings below may reference images not displayed]

FINDINGS: Lower chest: The visualized lung bases are clear. The visualized
heart and pericardium are unremarkable. A rounded hyperdense lesion
is seen within the retrocardiac, right paraesophageal region
demonstrating minimal mural calcification. This may represent a
hyperdense cystic lesion or solid renal mass. Differential
considerations include a foregut duplication cyst, a complex
pericardial cyst, or pathologic lymphadenopathy. This measures at
least 3.2 x 3.5 cm in greatest dimension. This is not completely
evaluated on this examination, but was described on a prior CT
examination of 01/11/2002 and is unchanged, by report, in size
though those images are not available for direct comparison. Is.

Hepatobiliary: No focal liver abnormality is seen. Status post
cholecystectomy. No biliary dilatation.

Pancreas: Unremarkable

Spleen: Unremarkable

Adrenals/Urinary Tract: The adrenal glands are unremarkable. The
kidneys are normal in position. There is marked right hydronephrosis
with dilation of the right renal pelvis but decompression of the
right ureter in keeping with a severe right ureteropelvic junction
obstruction. There is marked thinning of the right renal cortex
suggesting longstanding obstruction. Left kidney is normal in size.
4.6 cm exophytic simple cortical cyst arises from the upper pole. No
hydronephrosis on the left. No intrarenal or ureteral calculi. The
bladder is unremarkable.

Stomach/Bowel: Severe descending and sigmoid colonic diverticulosis.
Scattered diverticular seen throughout the remainder of the colon.
Stomach, small bowel, and large bowel are otherwise unremarkable. No
evidence of obstruction or focal inflammation. Appendix absent. No
free intraperitoneal gas or fluid.

Vascular/Lymphatic: Aortic atherosclerosis. No enlarged abdominal or
pelvic lymph nodes.

Reproductive: Uterus and bilateral adnexa are unremarkable.

Other: No abdominal wall hernia.  Rectum unremarkable.

Musculoskeletal: Left total hip arthroplasty has been performed.
Degenerative changes are seen in the lumbar spine with superimposed
lumbar dextroscoliosis. No acute bone abnormality. No lytic or
blastic bone lesion.
IMPRESSION: 3.5 cm middle mediastinal mass within the paraesophageal region
incompletely evaluated on this examination, but described on remote
prior examination of 01/11/2002 and 09/29/1999 and likely reflecting
a bronchogenic cyst.

Severe right UPJ obstruction, likely long standing, with associated
marked right renal cortical atrophy.

Severe distal colonic diverticulosis. No superimposed acute
inflammatory change.

Aortic Atherosclerosis ([id number]).

## 2022-01-31 IMAGING — CT CT CHEST W/O CM
2 of 4 series · 15 of 36 positions shown, 18 images · non-contrast
Comparison: [DATE] [DATE], [DATE]. [DATE] [DATE], [DATE]. Report of chest CT January 11, 2002.

CLINICAL DATA: Abnormal chest x-ray.

EXAM:
CT CHEST WITHOUT CONTRAST
TECHNIQUE: Multidetector CT imaging of the chest was performed following the
standard protocol without IV contrast.

[Series 2: chest 2.00 · axial · 0.61mm/px · z∈[-1199,-931]mm · 12 of 160 slices shown, 15 images]
[im 13/160  mediastinal]
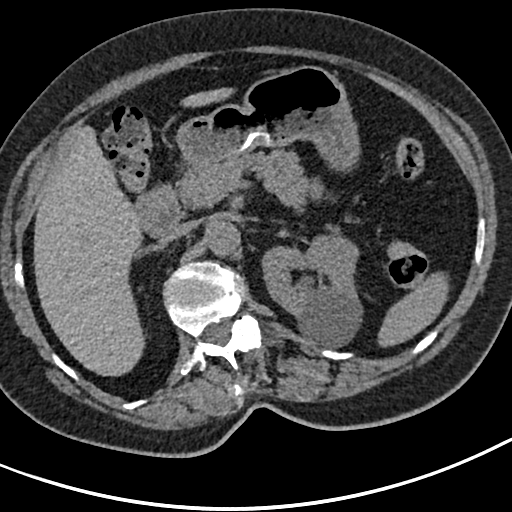
[im 13/160  lung]
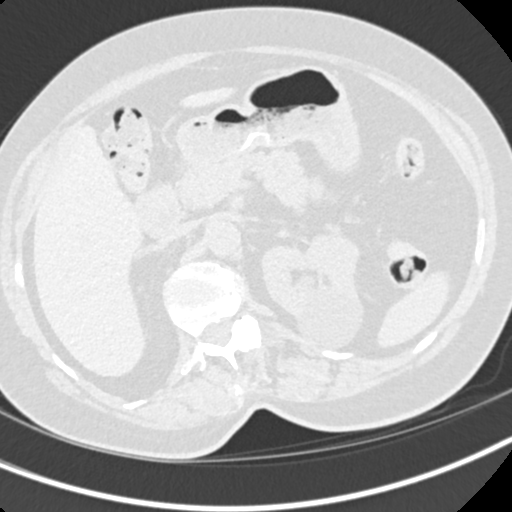
[im 25/160  lung]
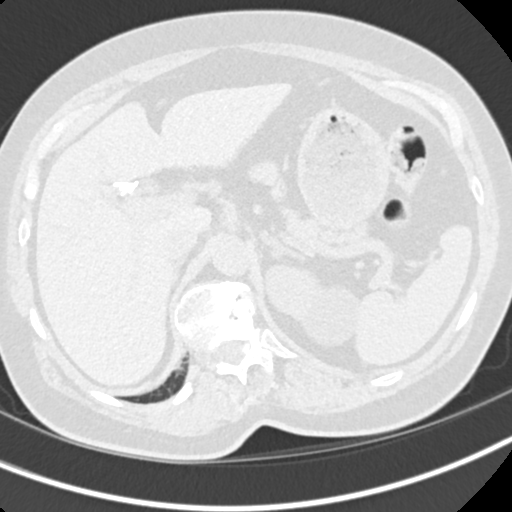
[im 37/160  lung]
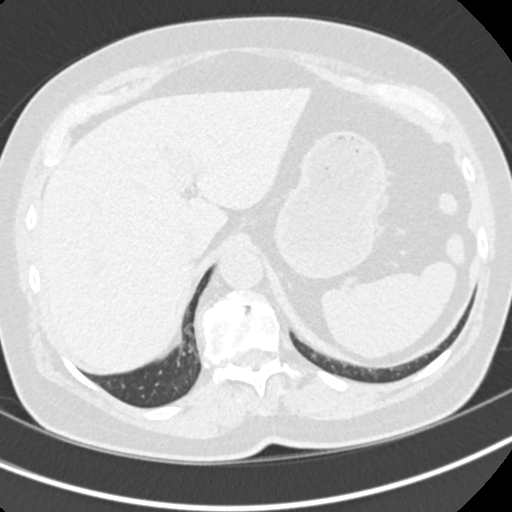
[im 49/160  lung]
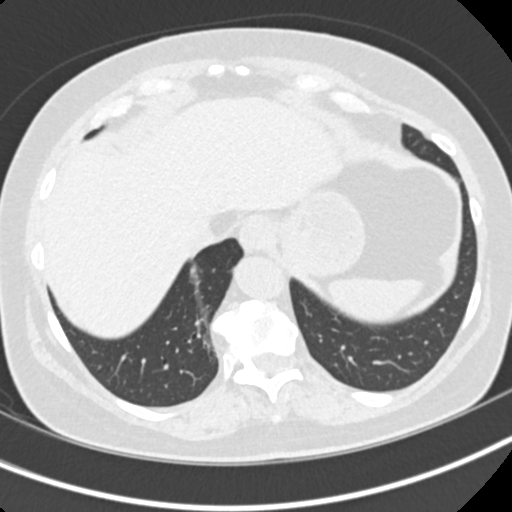
[im 62/160  mediastinal]
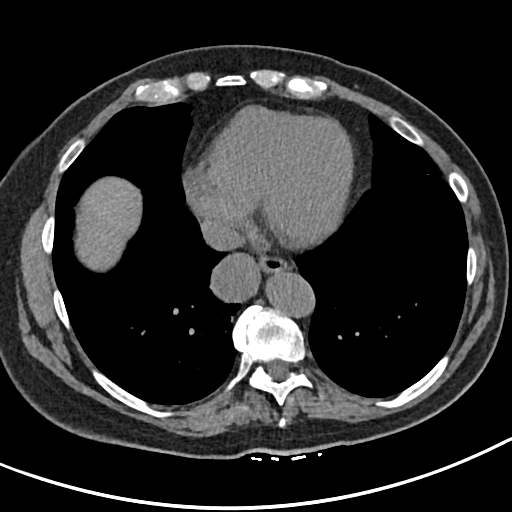
[im 62/160  lung]
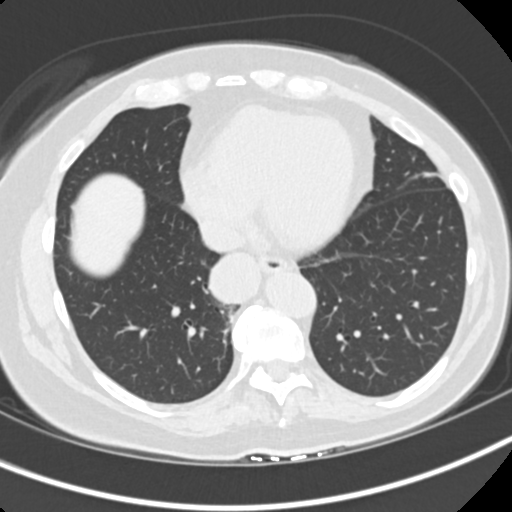
[im 74/160  lung]
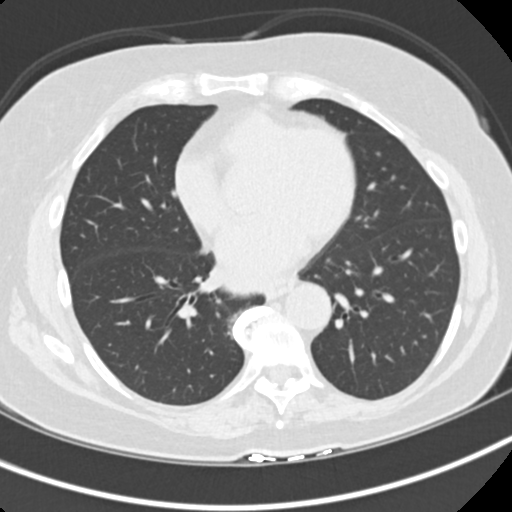
[im 86/160  lung]
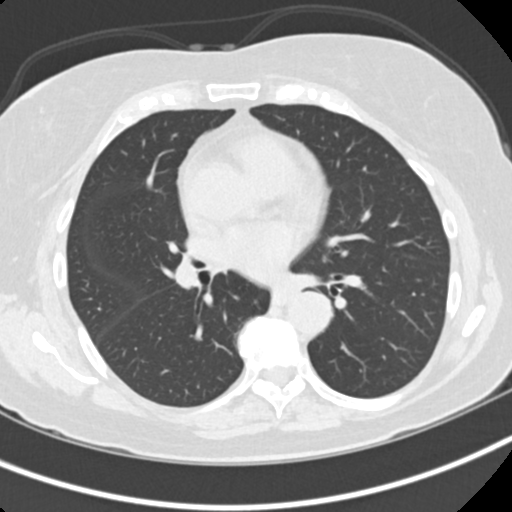
[im 98/160  lung]
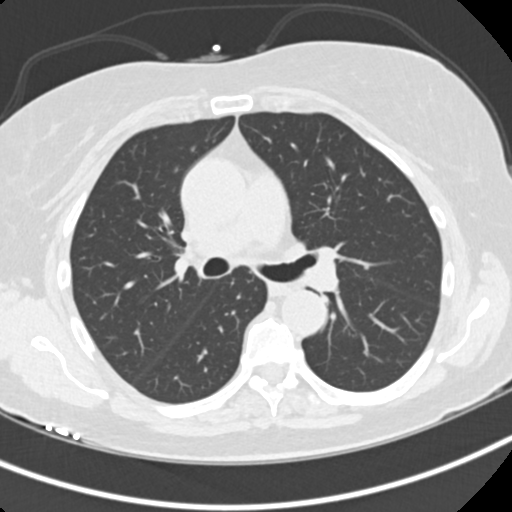
[im 111/160  mediastinal]
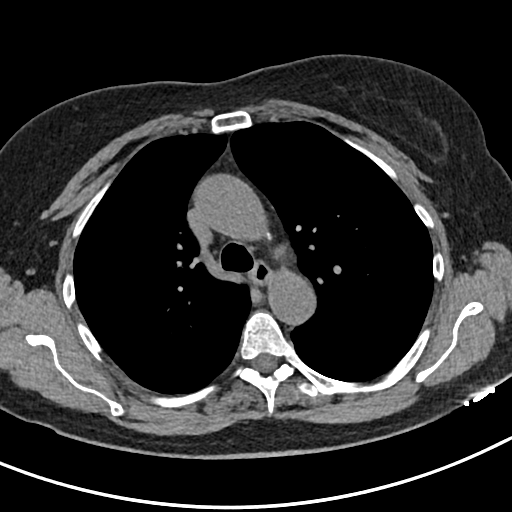
[im 111/160  lung]
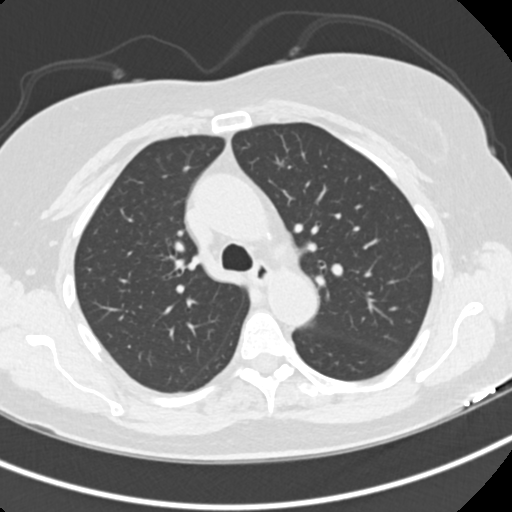
[im 123/160  lung]
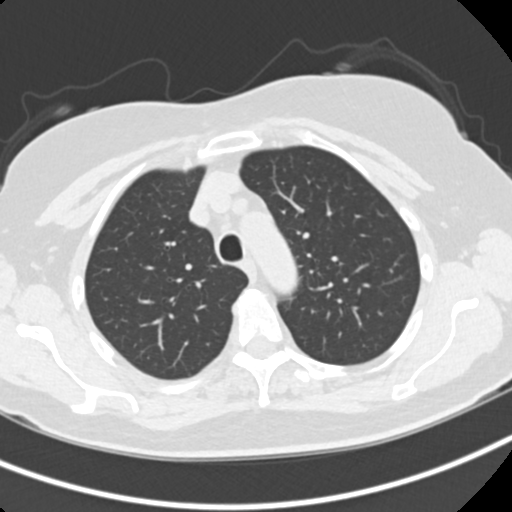
[im 135/160  lung]
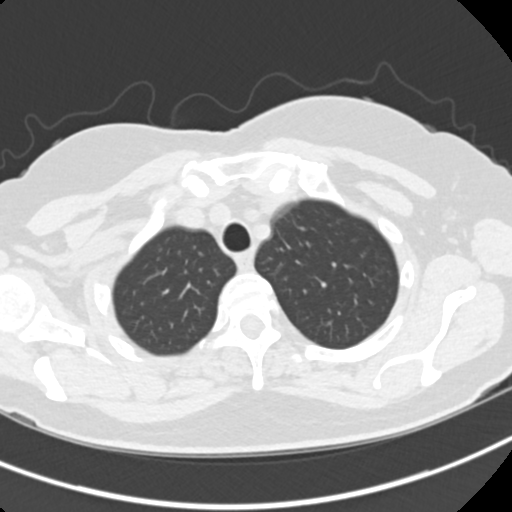
[im 147/160  lung]
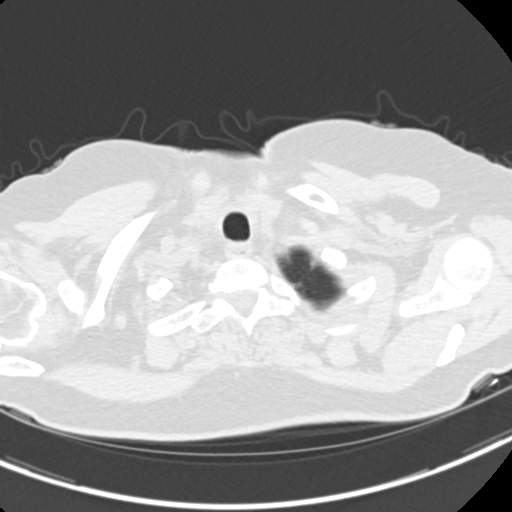

[Series 5: coronals chest 2.00 cor · coronal · 0.61mm/px · 3 of 133 slices shown]
[im 27/133  lung]
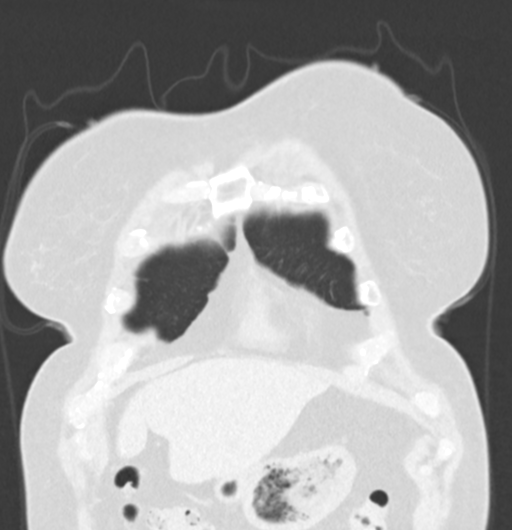
[im 53/133  lung]
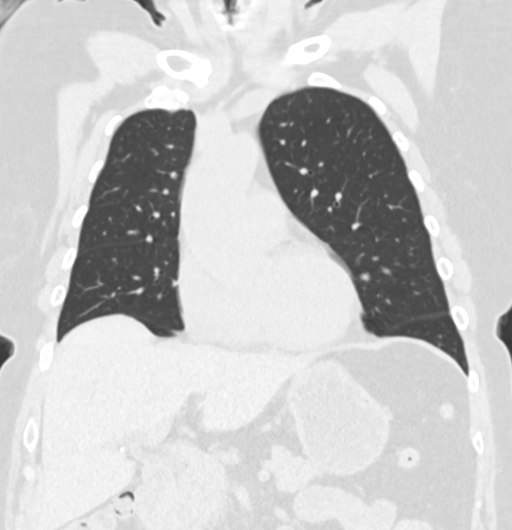
[im 80/133  lung]
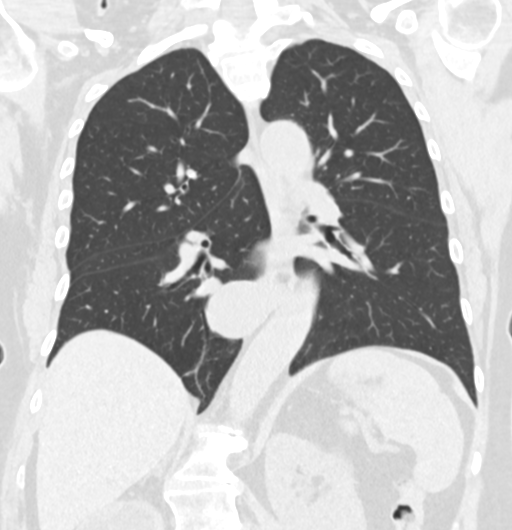

[15 of 36 positions shown; findings below may reference images not displayed]

FINDINGS: Cardiovascular: No significant vascular findings. Normal heart size.
No pericardial effusion.

Mediastinum/Nodes: The esophagus is unremarkable. Thyroid gland is
unremarkable. As noted on prior studies, there is a 3.8 x 3.4 cm
soft tissue structure posterior to the heart and to the right to the
right of the distal thoracic esophagus and to the right of the
descending thoracic aorta. This was described on the prior CT scan
of 0555 and appears grossly the same size and most likely represents
a bronchogenic cyst as suggested previously. No other adenopathy or
mass is noted in the mediastinum or axillary regions.

Lungs/Pleura: No pneumothorax or pleural effusion is noted. Probable
scarring is noted medially in the posterior right lung base. No
definite acute abnormality is noted.

Upper Abdomen: There is again noted severe right hydronephrosis
consistent with a history of longstanding severe right UPJ
obstruction.

Musculoskeletal: No chest wall mass or suspicious bone lesions
identified.
IMPRESSION: As noted on prior studies, there is noted a 3.8 by 3.4 cm soft
tissue structure posterior to the heart and to the right of the
distal thoracic esophagus and descending thoracic aorta. This was
described on the report of the CT scan of 0555 and it appears to be
unchanged in size based on that report. It most likely represents a
bronchogenic cyst as suggested previously.

No acute pulmonary parenchymal abnormality is noted.

Severe right hydronephrosis is again noted consistent with history
of longstanding severe right UPJ obstruction.

## 2022-03-25 ENCOUNTER — Encounter: Payer: Self-pay | Admitting: *Deleted

## 2022-03-28 ENCOUNTER — Ambulatory Visit: Payer: Medicare HMO | Admitting: Certified Registered Nurse Anesthetist

## 2022-03-28 ENCOUNTER — Encounter
Admission: RE | Disposition: A | Discharge status: Home / Self Care | Payer: Self-pay | Source: Home / Self Care | Attending: Gastroenterology

## 2022-03-28 ENCOUNTER — Ambulatory Visit
Admission: RE | Admit: 2022-03-28 | Discharge: 2022-03-28 | Disposition: A | Discharge status: Home / Self Care | Payer: Medicare HMO | Attending: Gastroenterology | Admitting: Gastroenterology

## 2022-03-28 ENCOUNTER — Encounter: Payer: Self-pay | Admitting: *Deleted

## 2022-03-28 DIAGNOSIS — Z9049 Acquired absence of other specified parts of digestive tract: Secondary | ICD-10-CM | POA: Insufficient documentation

## 2022-03-28 DIAGNOSIS — N183 Chronic kidney disease, stage 3 unspecified: Secondary | ICD-10-CM | POA: Diagnosis not present

## 2022-03-28 DIAGNOSIS — Z8371 Family history of colonic polyps: Secondary | ICD-10-CM | POA: Insufficient documentation

## 2022-03-28 DIAGNOSIS — Z800 Family history of malignant neoplasm of digestive organs: Secondary | ICD-10-CM | POA: Diagnosis not present

## 2022-03-28 DIAGNOSIS — E039 Hypothyroidism, unspecified: Secondary | ICD-10-CM | POA: Insufficient documentation

## 2022-03-28 DIAGNOSIS — I129 Hypertensive chronic kidney disease with stage 1 through stage 4 chronic kidney disease, or unspecified chronic kidney disease: Secondary | ICD-10-CM | POA: Insufficient documentation

## 2022-03-28 DIAGNOSIS — Z1211 Encounter for screening for malignant neoplasm of colon: Secondary | ICD-10-CM | POA: Diagnosis present

## 2022-03-28 DIAGNOSIS — K5730 Diverticulosis of large intestine without perforation or abscess without bleeding: Secondary | ICD-10-CM | POA: Diagnosis not present

## 2022-03-28 HISTORY — DX: Chronic kidney disease, unspecified: N18.9

## 2022-03-28 HISTORY — DX: Deficiency of other specified B group vitamins: E53.8

## 2022-03-28 HISTORY — PX: COLONOSCOPY: SHX5424

## 2022-03-28 SURGERY — COLONOSCOPY
Anesthesia: General

## 2022-03-28 MED ORDER — PROPOFOL 500 MG/50ML IV EMUL
INTRAVENOUS | Status: DC | PRN
  Administered 2022-03-28: 150 ug/kg/min via INTRAVENOUS

## 2022-03-28 MED ORDER — EPHEDRINE SULFATE (PRESSORS) 50 MG/ML IJ SOLN
INTRAMUSCULAR | Status: DC | PRN
  Administered 2022-03-28: 5 mg via INTRAVENOUS
  Administered 2022-03-28: 10 mg via INTRAVENOUS
  Administered 2022-03-28 (×2): 5 mg via INTRAVENOUS

## 2022-03-28 MED ORDER — PROPOFOL 10 MG/ML IV BOLUS
INTRAVENOUS | Status: DC | PRN
  Administered 2022-03-28: 30 mg via INTRAVENOUS
  Administered 2022-03-28: 20 mg via INTRAVENOUS
  Administered 2022-03-28: 80 mg via INTRAVENOUS

## 2022-03-28 MED ORDER — LIDOCAINE HCL (CARDIAC) PF 100 MG/5ML IV SOSY
PREFILLED_SYRINGE | INTRAVENOUS | Status: DC | PRN
  Administered 2022-03-28: 50 mg via INTRAVENOUS

## 2022-03-28 MED ORDER — PHENYLEPHRINE HCL (PRESSORS) 10 MG/ML IV SOLN
INTRAVENOUS | Status: DC | PRN
  Administered 2022-03-28 (×3): 160 ug via INTRAVENOUS

## 2022-03-28 MED ORDER — SODIUM CHLORIDE 0.9 % IV SOLN: INTRAVENOUS | Status: DC

## 2022-03-28 NOTE — Anesthesia Procedure Notes (Signed)
Date/Time: 03/28/2022 1:01 PM Performed by: Ginger Carne, CRNA Pre-anesthesia Checklist: Patient identified, Emergency Drugs available, Suction available, Patient being monitored and Timeout performed Patient Re-evaluated:Patient Re-evaluated prior to induction Oxygen Delivery Method: Nasal cannula Preoxygenation: Pre-oxygenation with 100% oxygen Induction Type: IV induction

## 2022-03-28 NOTE — Op Note (Signed)
[name] Gastroenterology Patient Name: Breanna Hanson Procedure Date: 03/28/2022 12:57 PM MRN: [id number] Account #: [id number] Date of Birth: 1950/11/10 Admit Type: Outpatient Age: 71 Room: [facility] ENDO ROOM 3 Gender: Female Note Status: Finalized Instrument Name: Jasper Riling [id number] Procedure:             Colonoscopy Indications:           Colon cancer screening in patient at increased risk:                         Family history of 1st-degree relative with colon polyps Providers:             Andrey Farmer MD, MD Referring MD:          Ramonita Lab, MD (Referring MD) Medicines:             Monitored Anesthesia Care Complications:         No immediate complications. Procedure:             Pre-Anesthesia Assessment:                        - Prior to the procedure, a History and Physical was                         performed, and patient medications and allergies were                         reviewed. The patient is competent. The risks and                         benefits of the procedure and the sedation options and                         risks were discussed with the patient. All questions                         were answered and informed consent was obtained.                         Patient identification and proposed procedure were                         verified by the physician, the nurse, the                         anesthesiologist, the anesthetist and the technician                         in the endoscopy suite. Mental Status Examination:                         alert and oriented. Airway Examination: normal                         oropharyngeal airway and neck mobility. Respiratory                         Examination: clear to auscultation. CV Examination:  normal. Prophylactic Antibiotics: The patient does not                         require prophylactic antibiotics. Prior                         Anticoagulants: The patient has  taken no previous                         anticoagulant or antiplatelet agents. ASA Grade                         Assessment: II - A patient with mild systemic disease.                         After reviewing the risks and benefits, the patient                         was deemed in satisfactory condition to undergo the                         procedure. The anesthesia plan was to use monitored                         anesthesia care (MAC). Immediately prior to                         administration of medications, the patient was                         re-assessed for adequacy to receive sedatives. The                         heart rate, respiratory rate, oxygen saturations,                         blood pressure, adequacy of pulmonary ventilation, and                         response to care were monitored throughout the                         procedure. The physical status of the patient was                         re-assessed after the procedure.                        After obtaining informed consent, the colonoscope was                         passed under direct vision. Throughout the procedure,                         the patient's blood pressure, pulse, and oxygen                         saturations were monitored continuously. The  Colonoscope was introduced through the anus and                         advanced to the the cecum, identified by appendiceal                         orifice and ileocecal valve. The colonoscopy was                         somewhat difficult due to restricted mobility of the                         colon. Successful completion of the procedure was                         aided by withdrawing the scope and replacing with the                         pediatric colonoscope. The patient tolerated the                         procedure well. The quality of the bowel preparation                         was good. Findings:      The  perianal and digital rectal examinations were normal.      A few small-mouthed diverticula were found in the ascending colon.      Multiple small-mouthed diverticula were found in the sigmoid colon.      The exam was otherwise without abnormality on direct and retroflexion       views. Impression:            - Diverticulosis in the ascending colon.                        - Diverticulosis in the sigmoid colon.                        - The examination was otherwise normal on direct and                         retroflexion views.                        - No specimens collected. Recommendation:        - Discharge patient to home.                        - Resume previous diet.                        - Continue present medications.                        - Repeat colonoscopy in 10 years for screening                         purposes. Procedure Code(s):     --- Professional ---  G0105, Colorectal cancer screening; colonoscopy on                         individual at high risk Diagnosis Code(s):     --- Professional ---                        Z83.71, Family history of colonic polyps                        K57.30, Diverticulosis of large intestine without                         perforation or abscess without bleeding CPT copyright 2019 [employer]. All rights reserved. The codes documented in this report are preliminary and upon coder review may  be revised to meet current compliance requirements. Andrey Farmer MD, MD 03/28/2022 1:42:10 PM Number of Addenda: 0 Note Initiated On: 03/28/2022 12:57 PM Scope Withdrawal Time: 0 hours 9 minutes 53 seconds  Total Procedure Duration: 0 hours 33 minutes 27 seconds  Estimated Blood Loss:  Estimated blood loss: none.      [facility]

## 2022-03-28 NOTE — Transfer of Care (Signed)
Immediate Anesthesia Transfer of Care Note  Patient: Breanna Hanson  Procedure(s) Performed: COLONOSCOPY  Patient Location: PACU  Anesthesia Type:General  Level of Consciousness: awake and drowsy  Airway & Oxygen Therapy: Patient Spontanous Breathing  Post-op Assessment: Report given to RN and Post -op Vital signs reviewed and stable  Post vital signs: Reviewed and stable  Last Vitals:  Vitals Value Taken Time  BP 97/56 03/28/22 1348  Temp 36.1 C 03/28/22 1344  Pulse 67 03/28/22 1348  Resp 26 03/28/22 1348  SpO2 100 % 03/28/22 1348    Last Pain:  Vitals:   03/28/22 1344  TempSrc: Temporal  PainSc:          Complications: No notable events documented.

## 2022-03-28 NOTE — Interval H&P Note (Signed)
History and Physical Interval Note:  03/28/2022 12:59 PM  [name]  has presented today for surgery, with the diagnosis of FH: colon polyps (Z83.71).  The various methods of treatment have been discussed with the patient and family. After consideration of risks, benefits and other options for treatment, the patient has consented to  Procedure(s): COLONOSCOPY (N/A) as a surgical intervention.  The patient's history has been reviewed, patient examined, no change in status, stable for surgery.  I have reviewed the patient's chart and labs.  Questions were answered to the patient's satisfaction.     Regis Bill  Ok to proceed with colonoscopy

## 2022-03-28 NOTE — Anesthesia Preprocedure Evaluation (Signed)
Anesthesia Evaluation  Patient identified by MRN, date of birth, ID band Patient awake    Reviewed: Allergy & Precautions, H&P , NPO status , Patient's Chart, lab work & pertinent test results, reviewed documented beta blocker date and time   History of Anesthesia Complications (+) PONV and history of anesthetic complications  Airway Mallampati: II   Neck ROM: full    Dental  (+) Poor Dentition   Pulmonary neg pulmonary ROS,    Pulmonary exam normal        Cardiovascular Exercise Tolerance: Poor hypertension, On Medications negative cardio ROS Normal cardiovascular exam Rhythm:regular Rate:Normal     Neuro/Psych negative neurological ROS  negative psych ROS   GI/Hepatic Neg liver ROS, GERD  Medicated,  Endo/Other  Hypothyroidism   Renal/GU Renal disease  negative genitourinary   Musculoskeletal   Abdominal   Peds  Hematology negative hematology ROS (+)   Anesthesia Other Findings Past Medical History: No date: Arthritis No date: Chronic kidney disease No date: Elevated lipids No date: GERD (gastroesophageal reflux disease) No date: Hypertension No date: Hypothyroidism No date: PONV (postoperative nausea and vomiting) No date: Pulmonary nodule No date: Vitamin B12 deficiency Past Surgical History: No date: APPENDECTOMY     Comment:  removed at time of c section No date: BREAST CYST ASPIRATION; Right No date: CESAREAN SECTION     Comment:  x 2 No date: CHOLECYSTECTOMY 08/14/2017: COLONOSCOPY WITH PROPOFOL; N/A     Comment:  Procedure: COLONOSCOPY WITH PROPOFOL;  Surgeon:               Christena Deem, MD;  Location: [facility] ENDOSCOPY;                Service: Endoscopy;  Laterality: N/A; No date: DILATION AND CURETTAGE OF UTERUS 01/2018: FRACTURE SURGERY; Right     Comment:  knee No date: JOINT REPLACEMENT 03/22/2018: KNEE ARTHROSCOPY WITH SUBCHONDROPLASTY; Right     Comment:  Procedure: KNEE  ARTHROSCOPY WITH SUBCHONDROPLASTY WITH               PARTIAL MEDIAL MENISECTOMY;  Surgeon: Kennedy Bucker, MD;               Location: [facility] ORS;  Service: Orthopedics;  Laterality:               Right; 06/12/2018: TOTAL HIP ARTHROPLASTY; Left     Comment:  Procedure: TOTAL HIP ARTHROPLASTY ANTERIOR APPROACH;                Surgeon: Kennedy Bucker, MD;  Location: [facility] ORS;                Service: Orthopedics;  Laterality: Left; 03/23/2021: TOTAL KNEE ARTHROPLASTY; Right     Comment:  Procedure: TOTAL KNEE ARTHROPLASTY - [place] to               Assist;  Surgeon: Kennedy Bucker, MD;  Location: [facility] ORS;              Service: Orthopedics;  Laterality: Right;   Reproductive/Obstetrics negative OB ROS                             Anesthesia Physical Anesthesia Plan  ASA: 3  Anesthesia Plan: General   Post-op Pain Management:    Induction:   PONV Risk Score and Plan:   Airway Management Planned:   Additional Equipment:   Intra-op Plan:   Post-operative Plan:  Informed Consent: I have reviewed the patients History and Physical, chart, labs and discussed the procedure including the risks, benefits and alternatives for the proposed anesthesia with the patient or authorized representative who has indicated his/her understanding and acceptance.     Dental Advisory Given  Plan Discussed with: CRNA  Anesthesia Plan Comments:         Anesthesia Quick Evaluation

## 2022-03-28 NOTE — H&P (Signed)
Outpatient short stay form Pre-procedure 03/28/2022  Regis Bill, MD  Primary Physician: Lynnea Ferrier, MD  Reason for visit:  Family history of polyps  History of present illness:    71 y/o lady with hypertension, CKD III, and hypothyroidism here for screening colonoscopy for family history of polyps. Last colonoscopy in 2018 with poor prep. History of esophageal cancer in her father. History of cholecystectomy and appendectomy.    Current Facility-Administered Medications:    0.9 %  sodium chloride infusion, , Intravenous, Continuous, Nafisah Runions, Rossie Muskrat, MD, Last Rate: 20 mL/hr at 03/28/22 1251, New Bag at 03/28/22 1251  Medications Prior to Admission  Medication Sig Dispense Refill Last Dose   Acetaminophen (TYLENOL EXTRA STRENGTH PO) Take 1,000 mg by mouth.      hydrochlorothiazide (HYDRODIURIL) 25 MG tablet Take 12.5 mg by mouth daily.   03/28/2022 at 0700   levothyroxine (SYNTHROID) 112 MCG tablet Take 112 mcg by mouth daily before breakfast.   03/28/2022 at 0700   loratadine (CLARITIN) 10 MG tablet Take 10 mg by mouth daily.      Ascorbic Acid (VITAMIN C) 1000 MG tablet Take 1,000 mg by mouth daily.      Calcium Carb-Cholecalciferol (CALCIUM 600/VITAMIN D3 PO) Take 1 tablet by mouth daily.      cyanocobalamin 1000 MCG tablet Take 1,000 mcg by mouth daily.      diphenhydrAMINE (BENADRYL) 25 MG tablet Take 25 mg by mouth daily as needed for allergies.      HYDROcodone-acetaminophen (NORCO/VICODIN) 5-325 MG tablet Take 1 tablet by mouth every 4 (four) hours as needed for moderate pain (pain score 4-6). 15 tablet 0    lisinopril (ZESTRIL) 20 MG tablet Take 20 mg by mouth daily.      MegaRed Omega-3 Krill Oil 500 MG CAPS Take 500 mg by mouth daily.      Multiple Vitamins-Minerals (MULTIVITAMIN WITH MINERALS) tablet Take 1 tablet by mouth daily.      simvastatin (ZOCOR) 40 MG tablet Take 40 mg by mouth every evening.       traZODone (DESYREL) 50 MG tablet Take 50 mg by mouth at  bedtime.        No Known Allergies   Past Medical History:  Diagnosis Date   Arthritis    Chronic kidney disease    Elevated lipids    GERD (gastroesophageal reflux disease)    Hypertension    Hypothyroidism    PONV (postoperative nausea and vomiting)    Pulmonary nodule    Vitamin B12 deficiency     Review of systems:  Otherwise negative.    Physical Exam  Gen: Alert, oriented. Appears stated age.  HEENT: PERRLA. Lungs: No respiratory distress CV: RRR Abd: soft, benign, no masses Ext: No edema    Planned procedures: Proceed with colonoscopy. The patient understands the nature of the planned procedure, indications, risks, alternatives and potential complications including but not limited to bleeding, infection, perforation, damage to internal organs and possible oversedation/side effects from anesthesia. The patient agrees and gives consent to proceed.  Please refer to procedure notes for findings, recommendations and patient disposition/instructions.     Regis Bill, MD [facility]

## 2022-03-29 ENCOUNTER — Encounter: Payer: Self-pay | Admitting: Gastroenterology

## 2022-03-29 NOTE — Anesthesia Postprocedure Evaluation (Signed)
Anesthesia Post Note  Patient: Breanna Hanson  Procedure(s) Performed: COLONOSCOPY  Patient location during evaluation: PACU Anesthesia Type: General Level of consciousness: awake and alert Pain management: pain level controlled Vital Signs Assessment: post-procedure vital signs reviewed and stable Respiratory status: spontaneous breathing, nonlabored ventilation, respiratory function stable and patient connected to nasal cannula oxygen Cardiovascular status: blood pressure returned to baseline and stable Postop Assessment: no apparent nausea or vomiting Anesthetic complications: no   No notable events documented.   Last Vitals:  Vitals:   03/28/22 1404 03/28/22 1413  BP: 93/80 109/60  Pulse: 69 65  Resp: (!) 22 18  Temp:    SpO2: 100% 100%    Last Pain:  Vitals:   03/28/22 1413  TempSrc:   PainSc: 0-No pain                 Yevette Edwards

## 2022-06-01 IMAGING — MG MM DIGITAL SCREENING BILAT W/ TOMO AND CAD
8 series · 8 of 24 positions shown · non-contrast
Comparison: Previous exam(s).

CLINICAL DATA: Screening.

EXAM:
DIGITAL SCREENING BILATERAL MAMMOGRAM WITH TOMOSYNTHESIS AND CAD
TECHNIQUE: Bilateral screening digital craniocaudal and mediolateral oblique
mammograms were obtained. Bilateral screening digital breast
tomosynthesis was performed. The images were evaluated with
computer-aided detection.

[L MLO synth-2D]
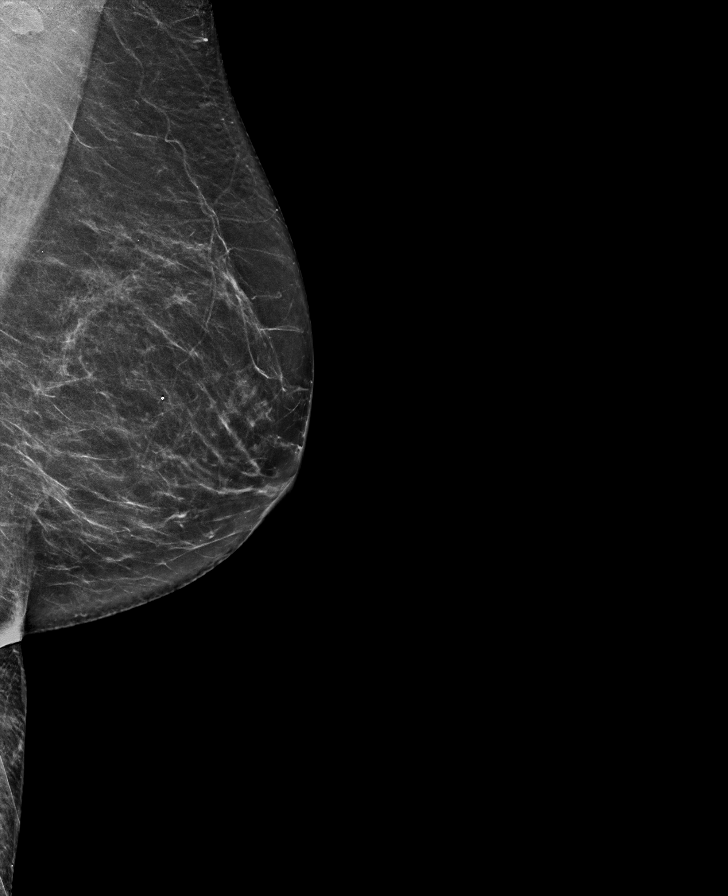

[R CC synth-2D]
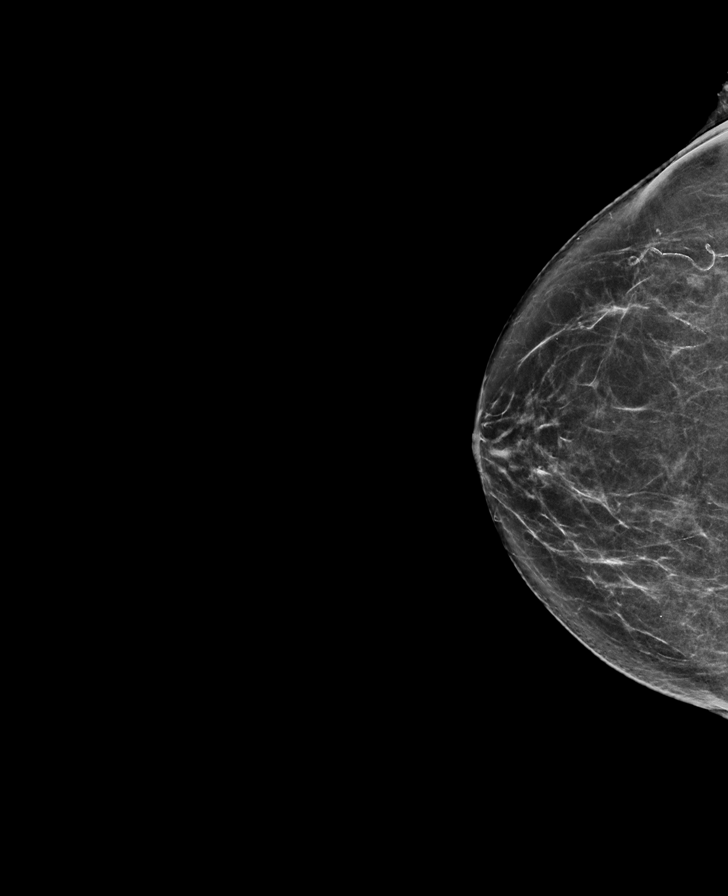

[R MLO synth-2D]
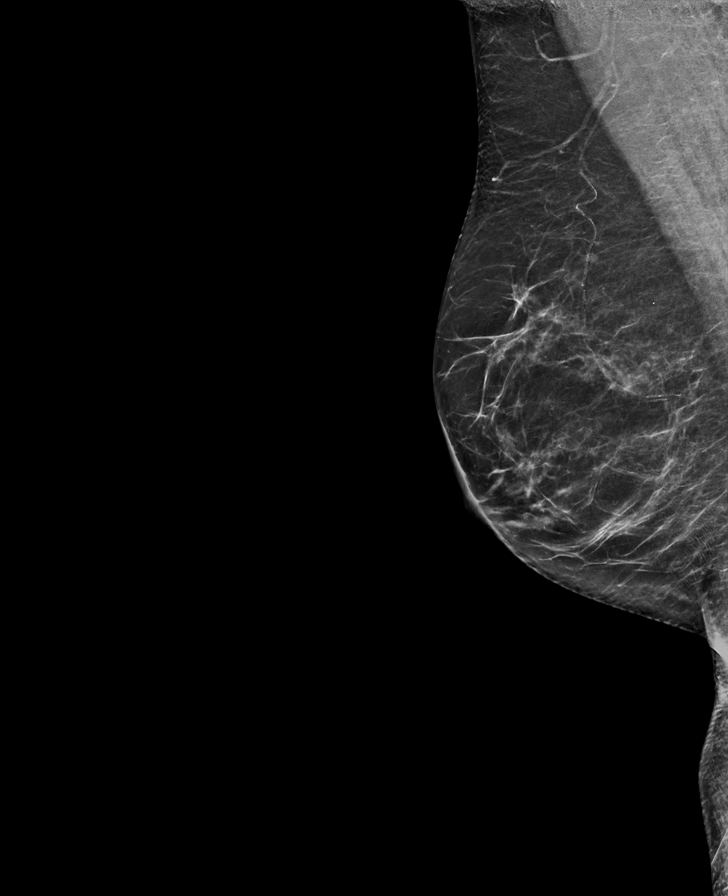

[L CC synth-2D]
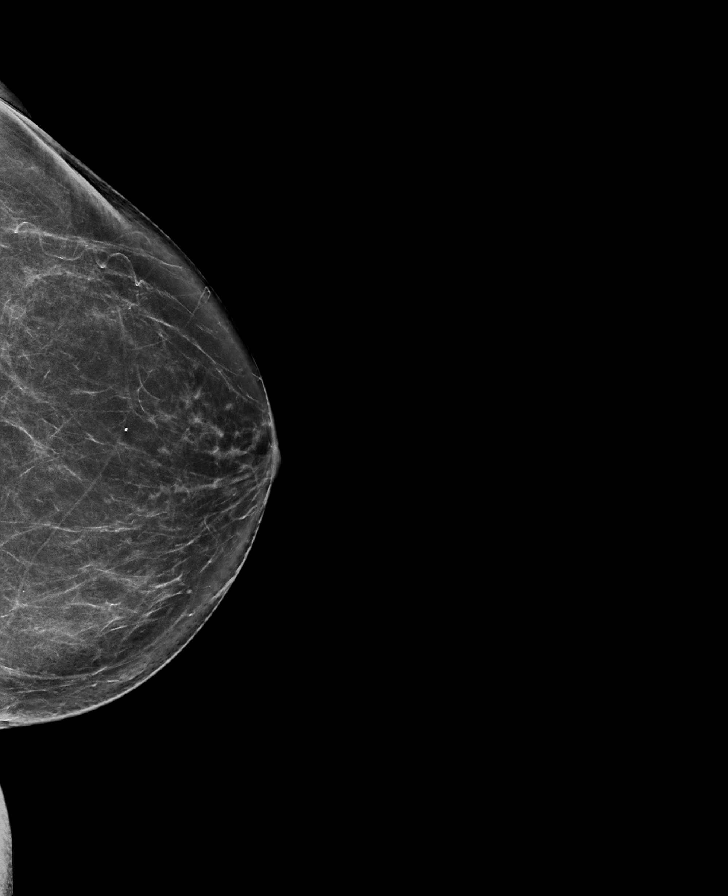

[R MLO tomo · tomo slice 34/67.0]
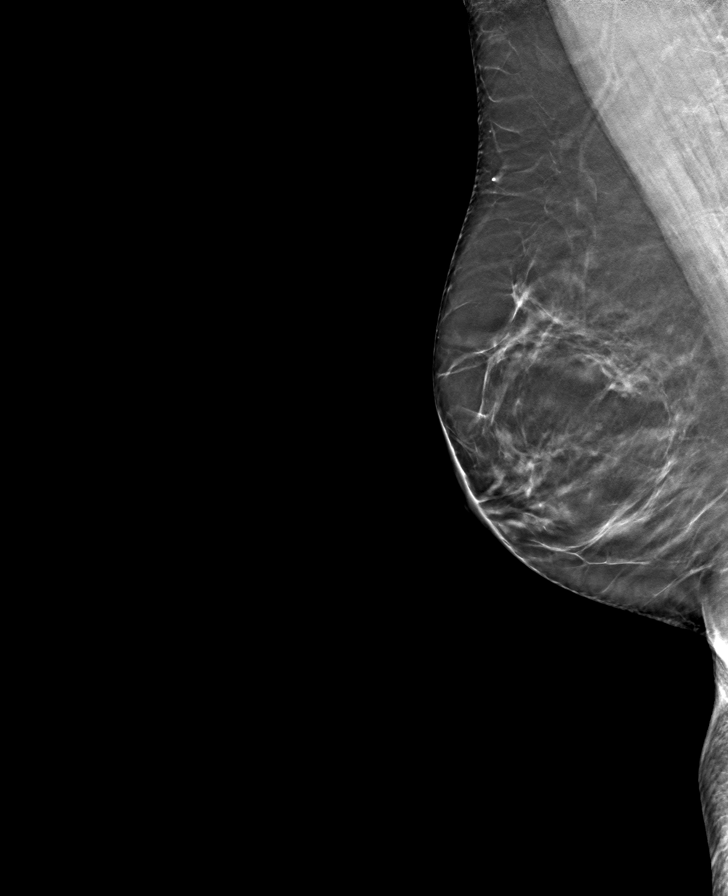

[R CC tomo · tomo slice 35/69.0]
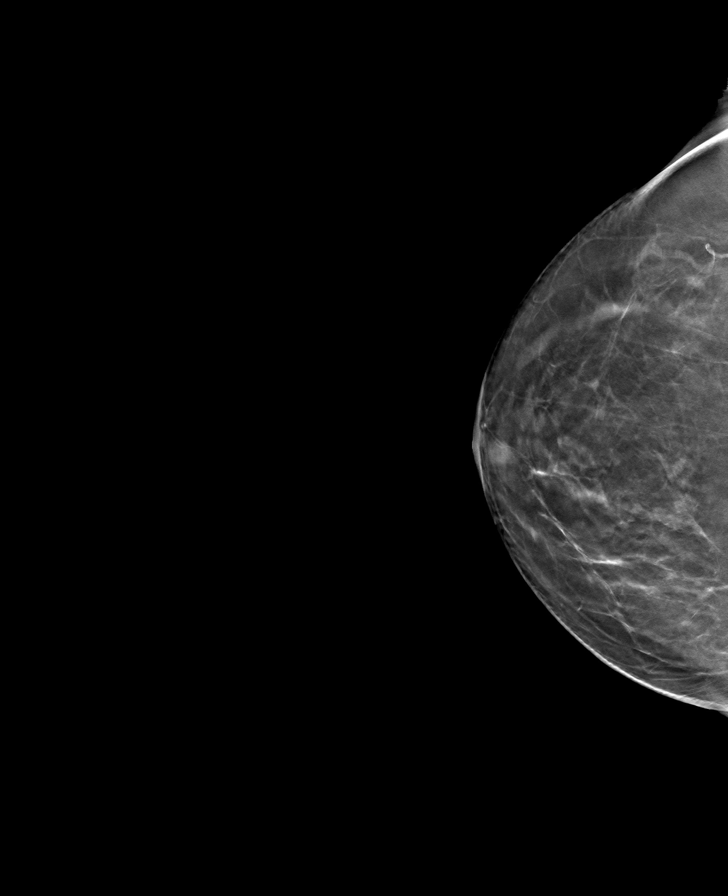

[L MLO tomo · tomo slice 36/71.0]
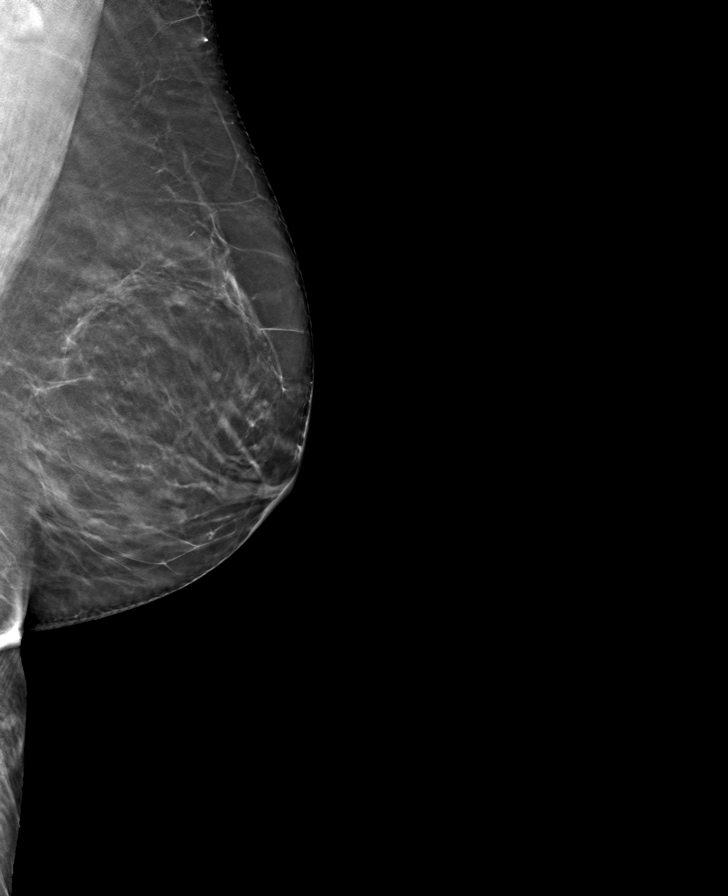

[L CC tomo · tomo slice 37/74.0]
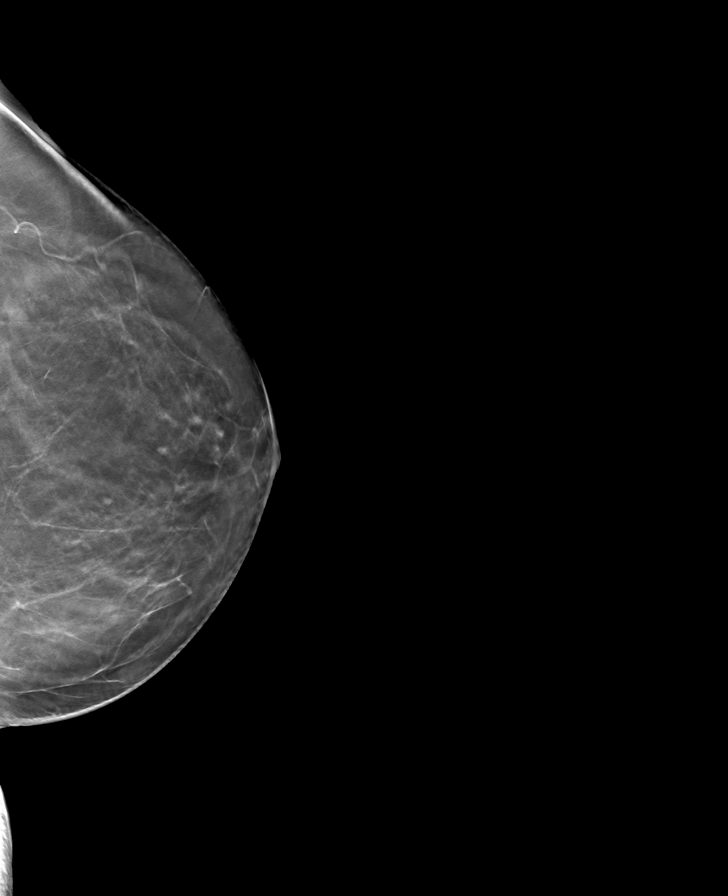

[8 of 24 positions shown; findings below may reference images not displayed]

ACR Breast Density Category b: There are scattered areas of
fibroglandular density.
FINDINGS: There are no findings suspicious for malignancy.
IMPRESSION: No mammographic evidence of malignancy. A result letter of this
screening mammogram will be mailed directly to the patient.

RECOMMENDATION:
Screening mammogram in one year. (Code:51-O-LD2)

BI-RADS CATEGORY  1: Negative.

## 2022-11-24 ENCOUNTER — Other Ambulatory Visit: Payer: Self-pay | Admitting: Internal Medicine

## 2022-11-24 DIAGNOSIS — Z1231 Encounter for screening mammogram for malignant neoplasm of breast: Secondary | ICD-10-CM

## 2022-12-26 ENCOUNTER — Ambulatory Visit
Admission: RE | Admit: 2022-12-26 | Discharge: 2022-12-26 | Disposition: A | Discharge status: Home / Self Care | Payer: Medicare HMO | Source: Ambulatory Visit | Attending: Internal Medicine | Admitting: Internal Medicine

## 2022-12-26 DIAGNOSIS — Z1231 Encounter for screening mammogram for malignant neoplasm of breast: Secondary | ICD-10-CM | POA: Insufficient documentation

## 2023-12-05 ENCOUNTER — Other Ambulatory Visit: Payer: Self-pay | Admitting: Internal Medicine

## 2023-12-05 DIAGNOSIS — Z1231 Encounter for screening mammogram for malignant neoplasm of breast: Secondary | ICD-10-CM

## 2024-01-03 ENCOUNTER — Ambulatory Visit
Admission: RE | Admit: 2024-01-03 | Discharge: 2024-01-03 | Disposition: A | Discharge status: Home / Self Care | Payer: Medicare HMO | Source: Ambulatory Visit | Attending: Internal Medicine | Admitting: Internal Medicine

## 2024-01-03 DIAGNOSIS — Z1231 Encounter for screening mammogram for malignant neoplasm of breast: Secondary | ICD-10-CM | POA: Diagnosis present
